# Patient Record
Sex: Female | Born: 1975
Health system: Southern US, Community
[De-identification: ages and names within clinical notes are randomized; demographics above are authoritative.]

## PROBLEM LIST (undated history)

## (undated) ENCOUNTER — Emergency Department (HOSPITAL_COMMUNITY): Admission: EM | Payer: Self-pay | Source: Home / Self Care

## (undated) DIAGNOSIS — K900 Celiac disease: Secondary | ICD-10-CM

## (undated) DIAGNOSIS — F32A Depression, unspecified: Secondary | ICD-10-CM

## (undated) DIAGNOSIS — F988 Other specified behavioral and emotional disorders with onset usually occurring in childhood and adolescence: Secondary | ICD-10-CM

## (undated) DIAGNOSIS — G588 Other specified mononeuropathies: Secondary | ICD-10-CM

## (undated) DIAGNOSIS — F329 Major depressive disorder, single episode, unspecified: Secondary | ICD-10-CM

## (undated) HISTORY — DX: Major depressive disorder, single episode, unspecified: F32.9

## (undated) HISTORY — DX: Other specified behavioral and emotional disorders with onset usually occurring in childhood and adolescence: F98.8

## (undated) HISTORY — DX: Other specified mononeuropathies: G58.8

## (undated) HISTORY — DX: Depression, unspecified: F32.A

## (undated) HISTORY — DX: Celiac disease: K90.0

---

## 2006-12-16 ENCOUNTER — Emergency Department (HOSPITAL_COMMUNITY): Admission: EM | Admit: 2006-12-16 | Discharge: 2006-12-16 | Payer: Self-pay | Admitting: Pediatrics

## 2010-08-03 ENCOUNTER — Inpatient Hospital Stay (HOSPITAL_COMMUNITY)
Admission: EM | Admit: 2010-08-03 | Discharge: 2010-08-06 | DRG: 917 | Disposition: A | Discharge status: Other Acute Inpatient Hospital | Payer: 59 | Attending: Internal Medicine | Admitting: Internal Medicine

## 2010-08-03 ENCOUNTER — Emergency Department (HOSPITAL_COMMUNITY): Payer: 59

## 2010-08-03 DIAGNOSIS — G929 Unspecified toxic encephalopathy: Secondary | ICD-10-CM | POA: Diagnosis present

## 2010-08-03 DIAGNOSIS — K900 Celiac disease: Secondary | ICD-10-CM | POA: Diagnosis present

## 2010-08-03 DIAGNOSIS — E872 Acidosis, unspecified: Secondary | ICD-10-CM | POA: Diagnosis present

## 2010-08-03 DIAGNOSIS — G92 Toxic encephalopathy: Secondary | ICD-10-CM | POA: Diagnosis present

## 2010-08-03 DIAGNOSIS — Y92009 Unspecified place in unspecified non-institutional (private) residence as the place of occurrence of the external cause: Secondary | ICD-10-CM

## 2010-08-03 DIAGNOSIS — L502 Urticaria due to cold and heat: Secondary | ICD-10-CM | POA: Diagnosis present

## 2010-08-03 DIAGNOSIS — T424X4A Poisoning by benzodiazepines, undetermined, initial encounter: Principal | ICD-10-CM | POA: Diagnosis present

## 2010-08-03 DIAGNOSIS — T438X2A Poisoning by other psychotropic drugs, intentional self-harm, initial encounter: Secondary | ICD-10-CM | POA: Diagnosis present

## 2010-08-03 DIAGNOSIS — R946 Abnormal results of thyroid function studies: Secondary | ICD-10-CM | POA: Diagnosis present

## 2010-08-03 DIAGNOSIS — T43502A Poisoning by unspecified antipsychotics and neuroleptics, intentional self-harm, initial encounter: Secondary | ICD-10-CM | POA: Diagnosis present

## 2010-08-03 LAB — CBC
Hemoglobin: 13.8 g/dL (ref 12.0–15.0)
MCH: 29.9 pg (ref 26.0–34.0)
MCHC: 34.6 g/dL (ref 30.0–36.0)
MCV: 86.6 fL (ref 78.0–100.0)
Platelets: 195 10*3/uL (ref 150–400)
RBC: 4.61 MIL/uL (ref 3.87–5.11)

## 2010-08-03 LAB — DIFFERENTIAL
Eosinophils Absolute: 0 10*3/uL (ref 0.0–0.7)
Lymphs Abs: 0.9 10*3/uL (ref 0.7–4.0)
Monocytes Absolute: 0.5 10*3/uL (ref 0.1–1.0)
Monocytes Relative: 8 % (ref 3–12)
Neutrophils Relative %: 75 % (ref 43–77)

## 2010-08-03 LAB — ACETAMINOPHEN LEVEL: Acetaminophen (Tylenol), Serum: <15 ug/mL (ref 10–30)

## 2010-08-03 LAB — COMPREHENSIVE METABOLIC PANEL
CO2: 20 mEq/L (ref 19–32)
Calcium: 8.6 mg/dL (ref 8.4–10.5)
Creatinine, Ser: 0.67 mg/dL (ref 0.50–1.10)
GFR calc Af Amer: >60 mL/min (ref >60)
GFR calc non Af Amer: >60 mL/min (ref >60)
Glucose, Bld: 189 mg/dL — ABNORMAL HIGH (ref 70–99)
Total Protein: 6.4 g/dL (ref 6.0–8.3)

## 2010-08-03 LAB — SALICYLATE LEVEL: Salicylate Lvl: <2 mg/dL — ABNORMAL LOW (ref 2.8–20.0)

## 2010-08-04 ENCOUNTER — Encounter (HOSPITAL_COMMUNITY): Payer: Self-pay

## 2010-08-04 ENCOUNTER — Inpatient Hospital Stay (HOSPITAL_COMMUNITY): Payer: 59

## 2010-08-04 LAB — CBC
HCT: 37.6 % (ref 36.0–46.0)
MCH: 29.9 pg (ref 26.0–34.0)
MCV: 89.1 fL (ref 78.0–100.0)
Platelets: 162 10*3/uL (ref 150–400)
RBC: 4.22 MIL/uL (ref 3.87–5.11)
WBC: 4.2 10*3/uL (ref 4.0–10.5)

## 2010-08-04 LAB — DIFFERENTIAL
Eosinophils Absolute: 0.1 10*3/uL (ref 0.0–0.7)
Lymphocytes Relative: 31 % (ref 12–46)
Lymphs Abs: 1.3 10*3/uL (ref 0.7–4.0)
Monocytes Relative: 9 % (ref 3–12)
Neutrophils Relative %: 58 % (ref 43–77)

## 2010-08-04 LAB — BASIC METABOLIC PANEL
BUN: 8 mg/dL (ref 6–23)
CO2: 23 mEq/L (ref 19–32)
Chloride: 117 mEq/L — ABNORMAL HIGH (ref 96–112)
GFR calc non Af Amer: >60 mL/min (ref >60)
Glucose, Bld: 99 mg/dL (ref 70–99)
Potassium: 4.3 mEq/L (ref 3.5–5.1)
Sodium: 144 mEq/L (ref 135–145)

## 2010-08-04 LAB — HEPATIC FUNCTION PANEL
AST: 20 U/L (ref 0–37)
Albumin: 2.7 g/dL — ABNORMAL LOW (ref 3.5–5.2)
Total Protein: 5.2 g/dL — ABNORMAL LOW (ref 6.0–8.3)

## 2010-08-04 LAB — BLOOD GAS, ARTERIAL
Acid-base deficit: 7.8 mmol/L — ABNORMAL HIGH (ref 0.0–2.0)
Bicarbonate: 19.5 mEq/L — ABNORMAL LOW (ref 20.0–24.0)
Delivery systems: POSITIVE
Drawn by: 232811
FIO2: 0.3 %
O2 Content: 2 L/min
Patient temperature: 96.5
TCO2: 18.4 mmol/L (ref 0–100)
pCO2 arterial: 49.5 mmHg — ABNORMAL HIGH (ref 35.0–45.0)
pCO2 arterial: 46.7 mmHg — ABNORMAL HIGH (ref 35.0–45.0)
pH, Arterial: 7.22 — ABNORMAL LOW (ref 7.350–7.400)

## 2010-08-04 LAB — GLUCOSE, CAPILLARY
Glucose-Capillary: 62 mg/dL — ABNORMAL LOW (ref 70–99)
Glucose-Capillary: 101 mg/dL — ABNORMAL HIGH (ref 70–99)

## 2010-08-04 LAB — CARDIAC PANEL(CRET KIN+CKTOT+MB+TROPI)
Relative Index: 1.7 (ref 0.0–2.5)
Relative Index: 1.9 (ref 0.0–2.5)
Relative Index: 1.5 (ref 0.0–2.5)
Troponin I: <0.3 ng/mL (ref <0.30)
Troponin I: <0.3 ng/mL (ref <0.30)

## 2010-08-04 LAB — MRSA PCR SCREENING: MRSA by PCR: NEGATIVE

## 2010-08-04 LAB — LACTIC ACID, PLASMA: Lactic Acid, Venous: 1.1 mmol/L (ref 0.5–2.2)

## 2010-08-04 LAB — ACETAMINOPHEN LEVEL: Acetaminophen (Tylenol), Serum: <15 ug/mL (ref 10–30)

## 2010-08-04 LAB — PREGNANCY, URINE: Preg Test, Ur: NEGATIVE

## 2010-08-05 DIAGNOSIS — F39 Unspecified mood [affective] disorder: Secondary | ICD-10-CM

## 2010-08-05 LAB — BASIC METABOLIC PANEL
Chloride: 110 mEq/L (ref 96–112)
GFR calc Af Amer: >60 mL/min (ref >60)
GFR calc non Af Amer: >60 mL/min (ref >60)
Potassium: 3.7 mEq/L (ref 3.5–5.1)
Sodium: 139 mEq/L (ref 135–145)

## 2010-08-05 LAB — CBC
Hemoglobin: 13.4 g/dL (ref 12.0–15.0)
MCHC: 33.8 g/dL (ref 30.0–36.0)
RDW: 13 % (ref 11.5–15.5)
WBC: 5.9 10*3/uL (ref 4.0–10.5)

## 2010-08-05 LAB — GLUCOSE, CAPILLARY: Glucose-Capillary: 130 mg/dL — ABNORMAL HIGH (ref 70–99)

## 2010-08-06 ENCOUNTER — Inpatient Hospital Stay (HOSPITAL_COMMUNITY)
Admission: AD | Admit: 2010-08-06 | Discharge: 2010-08-07 | DRG: 882 | Disposition: A | Discharge status: Home / Self Care | Payer: 59 | Source: Ambulatory Visit | Attending: Psychiatry | Admitting: Psychiatry

## 2010-08-06 DIAGNOSIS — F4324 Adjustment disorder with disturbance of conduct: Principal | ICD-10-CM

## 2010-08-06 DIAGNOSIS — Z6379 Other stressful life events affecting family and household: Secondary | ICD-10-CM

## 2010-08-06 DIAGNOSIS — L508 Other urticaria: Secondary | ICD-10-CM

## 2010-08-06 DIAGNOSIS — T424X4A Poisoning by benzodiazepines, undetermined, initial encounter: Secondary | ICD-10-CM

## 2010-08-06 DIAGNOSIS — T43502A Poisoning by unspecified antipsychotics and neuroleptics, intentional self-harm, initial encounter: Secondary | ICD-10-CM

## 2010-08-06 DIAGNOSIS — F39 Unspecified mood [affective] disorder: Secondary | ICD-10-CM

## 2010-08-06 DIAGNOSIS — T438X2A Poisoning by other psychotropic drugs, intentional self-harm, initial encounter: Secondary | ICD-10-CM

## 2010-08-06 DIAGNOSIS — K900 Celiac disease: Secondary | ICD-10-CM

## 2010-08-06 LAB — GLUCOSE, CAPILLARY
Glucose-Capillary: 74 mg/dL (ref 70–99)
Glucose-Capillary: 107 mg/dL — ABNORMAL HIGH (ref 70–99)
Glucose-Capillary: 81 mg/dL (ref 70–99)
Glucose-Capillary: 90 mg/dL (ref 70–99)
Glucose-Capillary: 116 mg/dL — ABNORMAL HIGH (ref 70–99)

## 2010-08-07 DIAGNOSIS — F39 Unspecified mood [affective] disorder: Secondary | ICD-10-CM

## 2010-08-10 NOTE — Assessment & Plan Note (Signed)
NAME:  Michaela Bryant, Michaela Bryant         ACCOUNT NO.:  [id number]  MEDICAL RECORD NO.:  [id number]  LOCATION:  0503                          [facility]ACILITY:  [facility]  PHYSICIAN:  Rudean Curt, MD       DATE OF BIRTH:  [birth year]  DATE OF ADMISSION:  08/06/2010 DATE OF DISCHARGE:                      PSYCHIATRIC ADMISSION ASSESSMENT   The patient is a 35 year old Caucasian female originally from [place], [place], recently of [place], [place]:  Four days prior to admission, the patient impulsively took her husband's Klonopin and drove a short distance to a park.  She was barely arousable when brought to the emergency room and gradually regained her alertness along with insight into how she needs to care for herself.  She describes realizing that she needs to care for herself, and the relationship that she has with her husband has been somewhat intense in that he has been confused in how much he is willing to stick with the relationship and has been interacting with females who are also married who are interfacing with him by telephone, by text and by [place].  She was disappointed in that the lying and the compulsively flirting with other women has persisted and that she deserves to be treated with more respect.  PAST PSYCHIATRIC HISTORY:  She was in a research study for irritable bowel syndrome and turned out to not even have irritable bowel.  Was exposed to Lexapro for 6 months with no ill effects.  SOCIAL HISTORY:  The patient has a bachelor's degree in psychology, works as a Tourist information centre manager, is the mother of 110-year-old and 42 year old girls and is married 6 years to an infectious disease physician employed at [employer].  FAMILY HISTORY:  There is alcohol on her mother's side of the family that has been quite prominent and is quite problematic for several members of the family.  ALCOHOL AND DRUG HISTORY:  She started using caffeine in the last 3-4 years.  She started  using alcohol at age 53 for the very first time. Experimented a few times with THC but denies exposure to any other substances.  She knows her family history is most concerned about becoming an alcoholic.  MEDICAL HISTORY:  The patient had an appointment for [facility] for Wednesday of this past week that happened to be the day after she took her overdose and was comatose and was unable to attend that appointment.  She is looking forward to rescheduling it.  MEDICAL PROBLEMS:  Include celiac on a paleolithic diet.  MEDICINES:  Include Loestrin with iron 21-day cycle from her OB/GYN doctor.  ALCOHOL AND DRUG HISTORY:  She has nausea response to ANY NARCOTIC SHE HAS EVER TAKEN.  POSITIVE PHYSICAL FINDINGS:  She has ecchymosis below the right eye that was sustained somehow during the interactions of 4 nights ago but does not recall exactly how she acquired that.  CBC and metabolic panel reveal no abnormalities or concern.  She is not pregnant.  Her acetaminophen level is less than 15.  Her salicylate level is less than 2.  Urine drug screen was positive for amphetamines, benzodiazepines and opiates.  Alcohol level was less than 33.  MENTAL STATUS:  The patient is  alert and cooperative with the interview and has good eye contact.  She has cogent, goal-directed thoughts noted. She has natural tone, volume and rate of conversational speech.  Her mood is a 2 on a 1 the least, 10 the most scale.  Anxiety is a 2 also on a 1 the least, 10 the most scale.  Thought processes:  She denies any suicidal or homicidal ideation.  She denies any hallucinations, illusions or delusions.  Cognitive:  She has clear sensorium.  She has intact memory to recent and remote events.  She has intact judgment and insight.  DIAGNOSES:  Axis I:  Adjustment disorder with disturbance of conduct. Axis II:  Deferred. Axis III:  Celiac disease, cold- and heat-induced urticaria. Axis IV:  Moderate  occupational problems. Axis V:  Current 45, highest in past year 39.  PLAN:  Discharge home to follow up with her primary care physician and continue in counseling with her husband.  Consider attending Alcoholics Anonymous support groups to help her be able to become more self-reliant and positive with her esteem and develop some confidence and competence. Tentative length of stay is going to be less than 24 hours.          ______________________________ Rudean Curt, MD     EW/MEDQ  D:  08/07/2010  T:  08/07/2010  Job:  [id number]  Electronically Signed by Rudean Curt  on 08/10/2010 07:59:41 AM

## 2010-08-10 NOTE — Discharge Summary (Signed)
  [name], Michaela Bryant         ACCOUNT NO.:  [id number]  MEDICAL RECORD NO.:  [id number]  LOCATION:  0503                          [facility]ILITY[facility]  [facility]  PHYSICIAN:  Rudean Curt, MD       DATE OF BIRTH:  1975/03/09  DATE OF ADMISSION:  08/06/2010 DATE OF DISCHARGE:  08/07/2010                              DISCHARGE SUMMARY   REASON FOR HOSPITALIZATION:  The patient had an impulsive suicide attempt in which she took her husband's Klonopin and drove a short distance to a park.  She was quite obtunded on presentation to the emergency room.  Her urine drug screen was positive for opiates, benzodiazepines, and amphetamines.  Her alcohol level was 33.  During the hospital course, she realized that there was a great deal of support that she had from her family and that she needed to be getting her own life and establishing her own confidence.  This was a rather significant realization for her and also a great outpouring of support that she had received from family and friends.  SIGNIFICANT FINDINGS:  She has no suicidal or homicidal ideation noted. The patient realizes that she is a different person now that she has attempted this suicide attempt.  She realizes that she needs to be doing several things different for herself to be a more competent and confident person in her life.  OPERATION:  None.  FINAL DIAGNOSES:  AXIS I:  Adjustment disorder with disturbance of conduct.  Intentional overdose with benzodiazepines. AXIS II:  Deferred. AXIS III:  Celiac disease.  Heat and cold-induced urticaria.  Elevated TSH. AXIS IV:  Moderate.  Occupational problems. AXIS V:  Current is 45, in the past years 6.  DISCHARGE CONDITION:  Stable.  DISPOSITION:  Discharged to home.  It seems that her biologic family of origin are quite supportive of her and are very clear in how they expect her to be treated, and she seemingly has gained a great deal of insight in how she needs to be expecting  people to treat her more positively.          ______________________________ Rudean Curt, MD     EW/MEDQ  D:  08/07/2010  T:  08/07/2010  Job:  [id number]  Electronically Signed by Rudean Curt  on 08/10/2010 08:00:06 AM

## 2010-08-17 NOTE — Discharge Summary (Signed)
  Michaela Bryant, Michaela Bryant         ACCOUNT NO.:  [id number]  MEDICAL RECORD NO.:  [id number]  LOCATION:  7591                         FACILITY:  [facility]  PHYSICIAN:  Kathie Dike, MD     DATE OF BIRTH:  1975/10/15  DATE OF ADMISSION:  08/03/2010 DATE OF DISCHARGE:                        DISCHARGE SUMMARY - REFERRING   PRIMARY CARE PHYSICIAN:  Unassigned.  DISCHARGE DIAGNOSES: 1. Intentional overdose with benzodiazepine. 2. Suicide attempt. 3. Toxic encephalopathy secondary to #1. 4. Acidosis, resolved. 5. History of celiac disease. 6. Cold and heat induced urticaria. 7. Elevated TSH.  DISCHARGE MEDICATIONS:  Loestrin Fe 1 tablet p.o. daily.  ADMISSION HISTORY:  This is a 35 year old female who had taken a large amount of Klonopin after having argument with her husband.  The patient left the house and was subsequently found by her husband.  He called 911 when the patient was initially found in a park.  The patient was drowsy, difficult to arouse and was brought to the ER for evaluation.  She was found to be hypotensive in the emergency room after receiving 3 L of fluid.  She was subsequently admitted to the step-down unit for close monitoring and further observation.  For details, please refer to history and physical per Dr. Hal Hope on August 1.  HOSPITAL COURSE: 1. Overdose.  The patient was kept on a one-to-one sitter.  Poison     control was called and was involved in her care.  Supportive     therapy was recommended.  The patient's mental status continued to     improve.  She is currently alert and oriented and back to her     baseline.  She was seen by psychiatry service and was recommended     for transfer to inpatient psychiatry for further stabilization.     The patient is agreeable for this as is her husband and she will be     transferred once a bed is available. 2. Encephalopathy secondary to #1.  This has since resolved with     supportive care.  Remainder  of the patient's medical issues     remained stable.  CONSULTATIONS:  Psychiatry, Dr. Sherlynn Stalls.  DIAGNOSTIC IMAGING:  Chest x-ray from August 1 shows bibasilar airspace disease, this represent pneumonia or atelectasis.  DISCHARGE INSTRUCTIONS:  The patient should continue on a gluten-free diet.  She can conduct her activities as tolerated.  She will be transferred to [facility] once the is available and she will need to follow up with a primary care physician once she is discharged from [facility].  We will recommend that she have a repeat thyroid function panel rechecked in 6 weeks by a primary care physician after which the decision to start any replaced therapy can be made at that time.     Kathie Dike, MD     JM/MEDQ  D:  08/06/2010  T:  08/06/2010  Job:  [id number]  Electronically Signed by Jolaine Artist Graciella Arment  on 08/17/2010 03:23:02 PM

## 2010-09-07 NOTE — H&P (Signed)
NAMEMAYSEL, MCCOLM         ACCOUNT NO.:  [id number]  MEDICAL RECORD NO.:  [id number]  LOCATION:  WLED                         FACILITY:  [facility]  PHYSICIAN:  Rise Patience, MDDATE OF BIRTH:  05/20/1975  DATE OF ADMISSION:  08/03/2010 DATE OF DISCHARGE:                             HISTORY & PHYSICAL   PRIMARY CARE PHYSICIAN:  Unassigned.  CHIEF COMPLAINT:  Drug overdose with suicidal intention.  HISTORY OF PRESENT ILLNESS:  A 35 year old female with a history of celiac disease and heat- and cold-induced urticaria.  She had some misunderstanding with her husband and took some Klonopin at around 7 o'clock, which belonged to her husband, which was witnessed by the husband and drove away.  Her husband called 21 and eventually the car was found at a park.  The patient was found drowsy, hardly arousable, and was brought to ER.  At this time, the patient is hardly arousable. The ER physician who initially saw the patient said she was arousable and that she was talking very little, but progressively she became more drowsy.  She also was found to be hypotensive, for which the patient is being given some bolus of fluids, so far 3 L have been given.  The patient's drug screen has come positive for amphetamine, benzodiazepine, and opiate.  The patient does have a bruise on the right eye and as I discussed with the patient's husband, the patient does sometimes hit herself or hit her head on the wall whenever she gets depressed and of late she has been drinking more alcohol than usual.  In addition, the patient's husband stated that he did have a Klonopin bottle, which was missing last week, which was filled by him, usually 1 mg tablets, about 50 tablets.  As per husband, there was no mention of any chest pain or shortness of breath; any nausea, vomiting, or abdominal pain; or any seizure-like activity before.  The patient did have a suicidal attempt in March 2012 when she  tried to swallow Klonopin, but later on husband was able to make her spit it out.  PAST MEDICAL HISTORY: 1. History of celiac disease. 2. Cold- and heat-induced urticaria.  PAST SURGICAL HISTORY:  She only has had some sinus surgery when she was small.  MEDICATIONS ON ADMISSION:  The patient takes birth control pills, probably Gwen Her, which  has to be verified.  ALLERGIES:  No known drug allergies.  SOCIAL HISTORY:  The patient does not smoke cigarettes.  Recently has been drinking more alcohol because of the recent stress as per husband. Denies any drug abuse.  Married.  FAMILY HISTORY:  Nothing contributory.  REVIEW OF SYSTEMS:  As per the history of present illness, nothing else significant.  PHYSICAL EXAMINATION:  GENERAL:  The patient examined at bedside, not in acute distress. VITAL SIGNS:  Blood pressure 93/48, pulse is around 60 per minute, temperature 97.5, respirations 12 per minute, O2 sat 100%. HEENT:  PERLA positive.  No facial asymmetry.  There is a bruise and some small ecchymoses on the right periorbital area.  No discharge from ears, eyes, nose, or mouth. NECK:  There is no neck rigidity. CHEST:  Bilateral air entry present.  No rhonchi, no crepitation. HEART:  S1 and S2 heard. ABDOMEN:  Soft, nontender.  Bowel sounds heard. CNS:  The patient is drowsy, hardly arousable and further CNS examination is not possible. EXTREMITIES:  Peripheral pulses felt.  No edema.  LABORATORY DATA:  EKG shows normal sinus rhythm with nonspecific ST-T changes.  Heart rate is around 65 beats per minute.  CT of the head without contrast shows normal CT, maxillofacial areas, negative for facial fracture.  CBC, WBC 6.1, hemoglobin is 13.8, hematocrit is 39.9, platelets 195.  Complete metabolic panel, sodium 586, potassium 3.3, chloride 106, carbon dioxide 20, anion gap is 12, glucose 189, BUN 11, creatinine 0.6, total bilirubin is 0.3, alkaline phosphatase 53, AST 24, ALT 14,  total protein 6.4, albumin 3.7, calcium 8.6.  Pregnancy screen is negative.  Acetaminophen level less than 15.  Salicylate level less than 2.  Urine drug screen is positive for amphetamine, benzodiazepine, and opiate.  Alcohol level less than 33.  ASSESSMENT: 1. Drug overdose with suicidal ideation. 2. History of celiac disease. 3. History of cold- and heat-induced urticaria.  PLAN: 1. At this time, we will admit the patient to ICU. 2. For her drug overdose, which at this time most likely could be from     [place], we do not know exactly how much dose she could have     taken.  As per husband, she might have taken up to 4 mg total, but     the bottle which he had last week which he had just recently filled     was lost.  The patient also is positive for amphetamine and     opioids, but negative for Tylenol at this time.  I did call Poison     Control at this time.  They have recommended supportive management.     We will get an ABG at this time, repeat labs again in a.m.  We will     also let PCCM know about the patient's admission.  At this time, we     are going to aggressively hydrate the patient. 3. At this time, the patient will be on seizure precautions and     suicide precaution with 1:1 sitter. 4. We will keep the patient on p.r.n. Ativan for any agitation or     seizure-like activity. 5. Once the patient is alert and awake, will need psychiatric consult. 6. Further recommendations as condition evolves.    Rise Patience, MD    ANK/MEDQ  D:  08/04/2010  T:  08/04/2010  Job:  [id number]  Electronically Signed by Gean Birchwood MD on 09/07/2010 08:57:36 AM

## 2012-06-16 DIAGNOSIS — IMO0002 Reserved for concepts with insufficient information to code with codable children: Secondary | ICD-10-CM | POA: Insufficient documentation

## 2012-11-12 ENCOUNTER — Other Ambulatory Visit: Payer: Self-pay | Admitting: Gastroenterology

## 2012-11-12 DIAGNOSIS — Z111 Encounter for screening for respiratory tuberculosis: Secondary | ICD-10-CM

## 2012-11-13 ENCOUNTER — Ambulatory Visit
Admission: RE | Admit: 2012-11-13 | Discharge: 2012-11-13 | Disposition: A | Discharge status: Home / Self Care | Payer: 59 | Source: Ambulatory Visit | Attending: Gastroenterology | Admitting: Gastroenterology

## 2012-11-13 DIAGNOSIS — Z111 Encounter for screening for respiratory tuberculosis: Secondary | ICD-10-CM

## 2013-03-06 ENCOUNTER — Emergency Department (HOSPITAL_COMMUNITY)
Admission: EM | Admit: 2013-03-06 | Discharge: 2013-03-07 | Disposition: A | Discharge status: Home / Self Care | Payer: 59 | Attending: Emergency Medicine | Admitting: Emergency Medicine

## 2013-03-06 ENCOUNTER — Emergency Department (HOSPITAL_COMMUNITY): Payer: 59

## 2013-03-06 ENCOUNTER — Encounter (HOSPITAL_COMMUNITY): Payer: Self-pay | Admitting: Emergency Medicine

## 2013-03-06 DIAGNOSIS — R51 Headache: Secondary | ICD-10-CM

## 2013-03-06 DIAGNOSIS — Z3202 Encounter for pregnancy test, result negative: Secondary | ICD-10-CM | POA: Insufficient documentation

## 2013-03-06 DIAGNOSIS — W1809XA Striking against other object with subsequent fall, initial encounter: Secondary | ICD-10-CM | POA: Insufficient documentation

## 2013-03-06 DIAGNOSIS — R110 Nausea: Secondary | ICD-10-CM

## 2013-03-06 DIAGNOSIS — Y9389 Activity, other specified: Secondary | ICD-10-CM | POA: Insufficient documentation

## 2013-03-06 DIAGNOSIS — Y929 Unspecified place or not applicable: Secondary | ICD-10-CM | POA: Insufficient documentation

## 2013-03-06 DIAGNOSIS — R519 Headache, unspecified: Secondary | ICD-10-CM

## 2013-03-06 DIAGNOSIS — S0990XA Unspecified injury of head, initial encounter: Secondary | ICD-10-CM | POA: Insufficient documentation

## 2013-03-06 DIAGNOSIS — R112 Nausea with vomiting, unspecified: Secondary | ICD-10-CM | POA: Insufficient documentation

## 2013-03-06 LAB — CBC
HCT: 39.7 % (ref 36.0–46.0)
Hemoglobin: 13.8 g/dL (ref 12.0–15.0)
MCH: 29.9 pg (ref 26.0–34.0)
MCHC: 34.8 g/dL (ref 30.0–36.0)
MCV: 86.1 fL (ref 78.0–100.0)
PLATELETS: 203 10*3/uL (ref 150–400)
RBC: 4.61 MIL/uL (ref 3.87–5.11)
RDW: 12.3 % (ref 11.5–15.5)
WBC: 4.9 10*3/uL (ref 4.0–10.5)

## 2013-03-06 MED ORDER — METOCLOPRAMIDE HCL 5 MG/ML IJ SOLN
10.0000 mg | Freq: Once | INTRAMUSCULAR | Status: AC
  Administered 2013-03-06: 10 mg via INTRAVENOUS
  Filled 2013-03-06: qty 2

## 2013-03-06 MED ORDER — PROMETHAZINE HCL 25 MG PO TABS
25.0000 mg | ORAL_TABLET | ORAL | Status: DC | PRN
Start: 2013-03-06 — End: 2016-11-05

## 2013-03-06 MED ORDER — DIPHENHYDRAMINE HCL 50 MG/ML IJ SOLN
25.0000 mg | Freq: Once | INTRAMUSCULAR | Status: AC
  Administered 2013-03-06: 25 mg via INTRAVENOUS
  Filled 2013-03-06: qty 1

## 2013-03-06 MED ORDER — PROMETHAZINE HCL 25 MG PO TABS
25.0000 mg | ORAL_TABLET | Freq: Once | ORAL | Status: DC
  Filled 2013-03-06: qty 1

## 2013-03-06 MED ORDER — SODIUM CHLORIDE 0.9 % IV BOLUS (SEPSIS)
1000.0000 mL | Freq: Once | INTRAVENOUS | Status: AC
  Administered 2013-03-06: 1000 mL via INTRAVENOUS

## 2013-03-06 NOTE — ED Provider Notes (Signed)
CSN: [id number]     Arrival date & time 03/06/13  2223 History   First MD Initiated Contact with Patient 03/06/13 2238     Chief Complaint  Patient presents with  . Headache  . Nausea  . Emesis     (Consider location/radiation/quality/duration/timing/severity/associated sxs/prior Treatment) HPI Comments: 38 year old female presents with headache, nausea, and vomiting. She states that 5 days ago she fell and hit her head on the dresser. Her headache improved and then yesterday she had her hair styled and she was told to swing her head back and forth very slow motion picture. During this time she shook her head back and forth for 49 seconds. Since then she has felt nauseous, had a bitemporal headache, and dizziness. She has a history of migraines because of his headache is low worse. The headache seems to come and go. She's tried Zofran at home without success. She still vomiting and having dizziness. Denies abdominal pain, diarrhea, or urinary symptoms. She'll blurry vision earlier this morning but denies any now. Her family is concerned about a concussion and/or brain bleed.   History reviewed. No pertinent past medical history. History reviewed. No pertinent past surgical history. No family history on file. History  Substance Use Topics  . Smoking status: Not on file  . Smokeless tobacco: Not on file  . Alcohol Use: Not on file   OB History   Grav Para Term Preterm Abortions TAB SAB Ect Mult Living                 Review of Systems  Eyes: Negative for visual disturbance.  Respiratory: Negative for shortness of breath.   Cardiovascular: Negative for chest pain.  Gastrointestinal: Positive for nausea and vomiting. Negative for abdominal pain.  Genitourinary: Negative for dysuria and menstrual problem.  Musculoskeletal: Negative for gait problem.  Neurological: Positive for dizziness and headaches. Negative for syncope, weakness and numbness.  All other systems reviewed and are  negative.      Allergies  Review of patient's allergies indicates no known allergies.  Home Medications  No current outpatient prescriptions on file. There were no vitals taken for this visit. Physical Exam  Nursing note and vitals reviewed. Constitutional: She is oriented to person, place, and time. She appears well-developed and well-nourished.  HENT:  Head: Normocephalic and atraumatic.  Right Ear: External ear normal.  Left Ear: External ear normal.  Nose: Nose normal.  Eyes: EOM are normal. Pupils are equal, round, and reactive to light. Right eye exhibits no discharge. Left eye exhibits no discharge.  No nystagmus  Cardiovascular: Normal rate, regular rhythm and normal heart sounds.   Pulmonary/Chest: Effort normal and breath sounds normal.  Abdominal: Soft. There is no tenderness.  Neurological: She is alert and oriented to person, place, and time. She has normal strength. No cranial nerve deficit or sensory deficit. GCS eye subscore is 4. GCS verbal subscore is 5. GCS motor subscore is 6.  CN 2-12 grossly intact. 5/5 Strength in all 4 extremities. Normal cerebellar testing.  Skin: Skin is warm and dry.    ED Course  Procedures (including critical care time) Labs Review Labs Reviewed  CBC  BASIC METABOLIC PANEL  URINALYSIS, ROUTINE W REFLEX MICROSCOPIC   Imaging Review Ct Head Wo Contrast  03/06/2013   CLINICAL DATA:  Syncope with question of head injury  EXAM: CT HEAD WITHOUT CONTRAST  TECHNIQUE: Contiguous axial images were obtained from the base of the skull through the vertex without intravenous contrast.  COMPARISON:  Prior CT from 07/06/2010  FINDINGS: Few scattered hypodensities within the periventricular and deep white matter are present, likely related to mild chronic microvascular ischemic disease. There is no acute intracranial hemorrhage or infarct. No mass lesion or midline shift. Gray-white matter differentiation is well maintained. Ventricles are normal in  size without evidence of hydrocephalus. CSF containing spaces are within normal limits. No extra-axial fluid collection.  The calvarium is intact.  Orbital soft tissues are within normal limits.  The paranasal sinuses and mastoid air cells are well pneumatized and free of fluid.  Scalp soft tissues are unremarkable.  IMPRESSION: No acute intracranial process.   Electronically Signed   By: Jeannine Boga M.D.   On: 03/06/2013 23:39     EKG Interpretation None      MDM   Final diagnoses:  Headache  Nausea    Patient is well appearing, has normal neuro exam. No nystagmus. Family concerned patient has subacute intracranial injury. CT head negative. Given no signs of trauma and gradual intermittent headache I doubt acute intracranial injury. Feels improved with headache cocktail. She states this is similar to her migraines but a little worse. No vomiting in ED. Benign labs. With normal neuro exam and well appearance I do not feel she needs further imaging or workup. I feel she can be treated symptomatically and follow up with PCP. Will ambulate prior to discharge.    Ephraim Hamburger, MD 03/07/13 [phone]

## 2013-03-06 NOTE — Discharge Instructions (Signed)

## 2013-03-06 NOTE — ED Notes (Signed)
MD at bedside. 

## 2013-03-06 NOTE — ED Notes (Signed)
Pt from home c/o a headache, nausea, and vomiting from a fall that occurred on Friday. She fell and hit top front part of her head on a safe. No LOC. Yesterday she felt okay until she had her hair dyed and the stylist got her to shake her head fast. Since then she has had increased nausea and vomiting. She took 6m of Zofran before arrival but has thrown up since. She also reports hitting her head in Dec. Then she was confused and nauseated.

## 2013-03-07 LAB — BASIC METABOLIC PANEL
BUN: 11 mg/dL (ref 6–23)
CHLORIDE: 103 meq/L (ref 96–112)
CO2: 26 mEq/L (ref 19–32)
Calcium: 9.3 mg/dL (ref 8.4–10.5)
Creatinine, Ser: 0.67 mg/dL (ref 0.50–1.10)
Glucose, Bld: 99 mg/dL (ref 70–99)
POTASSIUM: 3.5 meq/L — AB (ref 3.7–5.3)
Sodium: 142 mEq/L (ref 137–147)

## 2013-03-07 LAB — URINALYSIS, ROUTINE W REFLEX MICROSCOPIC
BILIRUBIN URINE: NEGATIVE
Glucose, UA: NEGATIVE mg/dL
Ketones, ur: 15 mg/dL — AB
NITRITE: NEGATIVE
PH: 6.5 (ref 5.0–8.0)
Protein, ur: NEGATIVE mg/dL
Specific Gravity, Urine: 1.014 (ref 1.005–1.030)
UROBILINOGEN UA: 0.2 mg/dL (ref 0.0–1.0)

## 2013-03-07 LAB — URINE MICROSCOPIC-ADD ON

## 2013-03-07 LAB — POC URINE PREG, ED: PREG TEST UR: NEGATIVE

## 2013-03-07 MED ORDER — PROMETHAZINE HCL 25 MG PO TABS
12.5000 mg | ORAL_TABLET | Freq: Once | ORAL | Status: AC
  Administered 2013-03-07: 12.5 mg via ORAL
  Filled 2013-03-07: qty 1

## 2013-03-07 NOTE — ED Notes (Signed)
Pt declined phenergan at this time.  Pt denied nausea.

## 2013-03-07 NOTE — ED Provider Notes (Signed)
Patient evaluated after labs reviewed. She still having some nausea at this time would like some Phenergan. She initially declined because in the past it has caused her to be very sedated.  She has gotten up and walked to the bathroom without any significant dizziness. No headache at this time. Reglan earlier did make her feel better but no nausea is returning. No nystagmus. Speech clear. Normal gait. No unilateral deficits. No neuro deficits. Given onset and mechanism, considered vertebral artery dissection but is not having any neuro deficits or strokelike symptoms. Discussed with neurology on call, Dr. Leonel Ramsay recommends treatment for migraine and if remains symptomatic consider MRI.   I discussed these recommendations with patient and her husband bedside. No MRI is available until the morning. They both feel comfortable with trying Phenergan now and agree to return the morning if symptoms are not improved, to have MRI at that time.    Michaela Lower, MD 03/07/13 [phone]

## 2013-03-07 NOTE — ED Notes (Signed)
Pt ambulated with minimal   Assistance no c/o dizziness or light headiness. Rn notifited

## 2013-04-14 ENCOUNTER — Other Ambulatory Visit: Payer: Self-pay | Admitting: Infectious Disease

## 2013-04-14 MED ORDER — POLYMYXIN B-TRIMETHOPRIM 10000-0.1 UNIT/ML-% OP SOLN: 1.0000 [drp] | OPHTHALMIC | Status: DC

## 2013-07-04 LAB — HM PAP SMEAR: HM Pap smear: NEGATIVE

## 2013-09-12 ENCOUNTER — Other Ambulatory Visit: Payer: Self-pay | Admitting: Infectious Disease

## 2014-01-13 ENCOUNTER — Other Ambulatory Visit: Payer: Self-pay | Admitting: Infectious Disease

## 2014-01-13 MED ORDER — ONDANSETRON HCL 4 MG PO TABS: 4.0000 mg | ORAL_TABLET | Freq: Three times a day (TID) | ORAL | Status: DC | PRN

## 2014-08-21 ENCOUNTER — Other Ambulatory Visit: Payer: Self-pay | Admitting: *Deleted

## 2014-08-21 DIAGNOSIS — M5442 Lumbago with sciatica, left side: Principal | ICD-10-CM

## 2014-08-21 DIAGNOSIS — M5441 Lumbago with sciatica, right side: Secondary | ICD-10-CM

## 2014-08-22 ENCOUNTER — Encounter (HOSPITAL_COMMUNITY): Payer: Self-pay

## 2014-08-22 ENCOUNTER — Ambulatory Visit (HOSPITAL_COMMUNITY)
Admission: RE | Admit: 2014-08-22 | Discharge: 2014-08-22 | Disposition: A | Discharge status: Home / Self Care | Payer: 59 | Source: Ambulatory Visit | Attending: Infectious Diseases | Admitting: Infectious Diseases

## 2014-08-22 DIAGNOSIS — M5441 Lumbago with sciatica, right side: Secondary | ICD-10-CM

## 2014-08-22 DIAGNOSIS — M5442 Lumbago with sciatica, left side: Secondary | ICD-10-CM

## 2014-08-22 DIAGNOSIS — M545 Low back pain: Secondary | ICD-10-CM | POA: Diagnosis present

## 2014-08-22 DIAGNOSIS — M5127 Other intervertebral disc displacement, lumbosacral region: Secondary | ICD-10-CM | POA: Insufficient documentation

## 2014-08-22 MED ORDER — GADOBENATE DIMEGLUMINE 529 MG/ML IV SOLN
10.0000 mL | Freq: Once | INTRAVENOUS | Status: AC
  Administered 2014-08-22: 10 mL via INTRAVENOUS

## 2015-03-22 ENCOUNTER — Other Ambulatory Visit: Payer: Self-pay | Admitting: Infectious Disease

## 2015-03-22 DIAGNOSIS — J111 Influenza due to unidentified influenza virus with other respiratory manifestations: Secondary | ICD-10-CM

## 2015-03-22 MED ORDER — PREDNISONE 20 MG PO TABS: 40.0000 mg | ORAL_TABLET | Freq: Every day | ORAL | Status: DC

## 2015-03-22 MED ORDER — OSELTAMIVIR PHOSPHATE 75 MG PO CAPS: 75.0000 mg | ORAL_CAPSULE | Freq: Two times a day (BID) | ORAL | Status: DC

## 2015-04-14 DIAGNOSIS — Z319 Encounter for procreative management, unspecified: Secondary | ICD-10-CM | POA: Diagnosis not present

## 2015-05-11 DIAGNOSIS — H5213 Myopia, bilateral: Secondary | ICD-10-CM | POA: Diagnosis not present

## 2015-05-11 DIAGNOSIS — H52223 Regular astigmatism, bilateral: Secondary | ICD-10-CM | POA: Diagnosis not present

## 2015-06-03 DIAGNOSIS — R600 Localized edema: Secondary | ICD-10-CM | POA: Diagnosis not present

## 2015-06-03 DIAGNOSIS — F900 Attention-deficit hyperactivity disorder, predominantly inattentive type: Secondary | ICD-10-CM | POA: Diagnosis not present

## 2015-06-04 MED FILL — HYDROCHLOROTHIAZIDE 12.5 MG: 12.5 | 30 days supply | Qty: 30 | Fill #0 | Status: TO

## 2015-06-26 DIAGNOSIS — R600 Localized edema: Secondary | ICD-10-CM | POA: Diagnosis not present

## 2015-07-01 ENCOUNTER — Other Ambulatory Visit: Payer: Self-pay

## 2015-07-01 DIAGNOSIS — R609 Edema, unspecified: Secondary | ICD-10-CM

## 2015-08-06 MED FILL — HYDROCHLOROTHIAZIDE 12.5 MG: 12.5 | 30 days supply | Qty: 30 | Fill #0

## 2015-08-18 ENCOUNTER — Encounter: Payer: Self-pay | Admitting: Vascular Surgery

## 2015-08-20 ENCOUNTER — Encounter: Payer: Self-pay | Admitting: Vascular Surgery

## 2015-08-20 ENCOUNTER — Ambulatory Visit (INDEPENDENT_AMBULATORY_CARE_PROVIDER_SITE_OTHER): Payer: 59 | Admitting: Vascular Surgery

## 2015-08-20 ENCOUNTER — Ambulatory Visit (HOSPITAL_COMMUNITY)
Admission: RE | Admit: 2015-08-20 | Discharge: 2015-08-20 | Disposition: A | Discharge status: Home / Self Care | Payer: 59 | Source: Ambulatory Visit | Attending: Vascular Surgery | Admitting: Vascular Surgery

## 2015-08-20 VITALS — BP 116/76 | HR 74 | Ht <= 58 in | Wt 112.6 lb

## 2015-08-20 DIAGNOSIS — R609 Edema, unspecified: Secondary | ICD-10-CM | POA: Insufficient documentation

## 2015-08-20 DIAGNOSIS — M7989 Other specified soft tissue disorders: Secondary | ICD-10-CM | POA: Diagnosis not present

## 2015-08-20 NOTE — Progress Notes (Signed)
Referring Physician: Hortencia Conradi  Patient name: Michaela Bryant MRN: [id number] DOB: 1975/06/02 Sex: female  REASON FOR CONSULT: Chronic bilateral leg swelling  HPI: Michaela Bryant is a 40 y.o. female,  who complains of chronic leg swelling that has been going on for several years. She states the swelling gets worse in her legs after standing all day. She states that her legs are usually normal in the morning. She has not noticed any relationship of swelling with her menstrual cycle. She denies any trauma to the lower extremities. She has been placed on hydrochlorothiazide recently and this did help somewhat. She has no family history of varicose veins or hypercoagulable state. She has no personal history of DVT. She has no family history of varicose veins. She states both legs will swell really symmetrically and equally.   History reviewed. No pertinent past medical history. History reviewed. No pertinent surgical history.  History reviewed. No pertinent family history.  SOCIAL HISTORY: Social History   Social History  . Marital status: Married    Spouse name: N/A  . Number of children: N/A  . Years of education: N/A   Occupational History  . Not on file.   Social History Main Topics  . Smoking status: Never Smoker  . Smokeless tobacco: Never Used  . Alcohol use Yes     Comment: occasionally   . Drug use: No  . Sexual activity: Not on file   Other Topics Concern  . Not on file   Social History Narrative  . No narrative on file    No Known Allergies  Current Outpatient Prescriptions  Medication Sig Dispense Refill  . hydrochlorothiazide (MICROZIDE) 12.5 MG capsule TAKE 1 CAPSULE BY MOUTH DAILY IN THE MORNING AS NEEDED  0  . ibuprofen (ADVIL,MOTRIN) 200 MG tablet Take 800 mg by mouth every 6 (six) hours as needed.    . ondansetron (ZOFRAN) 4 MG tablet Take 1 tablet (4 mg total) by mouth every 8 (eight) hours as needed for nausea or vomiting. 90 tablet 0  .  predniSONE (DELTASONE) 20 MG tablet Take 2 tablets (40 mg total) by mouth daily with breakfast. 10 tablet 0  . promethazine (PHENERGAN) 25 MG tablet Take 1 tablet (25 mg total) by mouth every 4 (four) hours as needed for nausea or vomiting. 20 tablet 0  . trimethoprim-polymyxin b (POLYTRIM) ophthalmic solution Place 1 drop into both eyes every 4 (four) hours. 10 mL 0  . amphetamine-dextroamphetamine (ADDERALL XR) 10 MG 24 hr capsule Take 10 mg by mouth daily.  0  . oseltamivir (TAMIFLU) 75 MG capsule Take 1 capsule (75 mg total) by mouth 2 (two) times daily. (Patient not taking: Reported on 08/20/2015) 10 capsule 0   No current facility-administered medications for this visit.     ROS:   General:  No weight loss, Fever, chills  HEENT: No recent headaches, no nasal bleeding, no visual changes, no sore throat  Neurologic: No dizziness, blackouts, seizures. No recent symptoms of stroke or mini- stroke. No recent episodes of slurred speech, or temporary blindness.  Cardiac: No recent episodes of chest pain/pressure, no shortness of breath at rest.  No shortness of breath with exertion.  Denies history of atrial fibrillation or irregular heartbeat  Vascular: No history of rest pain in feet.  No history of claudication.  No history of non-healing ulcer, No history of DVT   Pulmonary: No home oxygen, no productive cough, no hemoptysis,  No asthma or wheezing  Musculoskeletal:  [ ]   Arthritis, [ ]  Low back pain,  [ ]  Joint pain  Hematologic:No history of hypercoagulable state.  No history of easy bleeding.  No history of anemia  Gastrointestinal: No hematochezia or melena,  No gastroesophageal reflux, no trouble swallowing  Urinary: [ ]  chronic Kidney disease, [ ]  on HD - [ ]  MWF or [ ]  TTHS, [ ]  Burning with urination, [ ]  Frequent urination, [ ]  Difficulty urinating;   Skin: No rashes  Psychological: No history of anxiety,  No history of depression   Physical Examination  General:   Alert and oriented, no acute distress HEENT: Normal Neck: No bruit or JVD Pulmonary: Clear to auscultation bilaterally Cardiac: Regular Rate and Rhythm without murmur Abdomen: Soft, non-tender, non-distended, no mass, no scars Skin: No rash Extremity Pulses:  2+ radial, brachial, femoral, dorsalis pedis, posterior tibial pulses bilaterally Musculoskeletal: No deformity or edema  Neurologic: Upper and lower extremity motor 5/5 and symmetric  DATA:  Patient had a venous duplex exam today which I reviewed and interpreted. This showed no evidence of reflux in the deep or superficial venous system. There was also no evidence of DVT.  ASSESSMENT:  Chronic intermittent leg swelling no evidence of venous or arterial disease as a possible etiology.   PLAN:  Patient was eyes to wear some compression stockings were symptomatically relief. She will continue to follow up with her primary care physician at the swelling becomes more bothersome in the future. She apparently has undergone previous blood work suggesting that there is no renal or hepatic etiology. On the other option would be whether or not to look for a cardiac etiology but she really has no symptoms suggestive of distant believe that overall this would be probably low yield. The patient will follow-up with me on an as-needed basis.   Ruta Hinds, MD [facility] Office: [phone] Pager: [phone]

## 2015-09-01 DIAGNOSIS — Z23 Encounter for immunization: Secondary | ICD-10-CM | POA: Diagnosis not present

## 2015-09-02 MED FILL — HYDROCHLOROTHIAZIDE 12.5 MG: 12.5 | 30 days supply | Qty: 30 | Fill #1

## 2015-10-08 MED FILL — HYDROCHLOROTHIAZIDE 12.5 MG: 12.5 | 30 days supply | Qty: 30 | Fill #2

## 2015-11-06 MED FILL — HYDROCHLOROTHIAZIDE 12.5 MG: 12.5 | 30 days supply | Qty: 30 | Fill #0

## 2015-11-20 DIAGNOSIS — F900 Attention-deficit hyperactivity disorder, predominantly inattentive type: Secondary | ICD-10-CM | POA: Diagnosis not present

## 2015-11-20 DIAGNOSIS — R600 Localized edema: Secondary | ICD-10-CM | POA: Diagnosis not present

## 2015-12-04 MED FILL — HYDROCHLOROTHIAZIDE 12.5 MG: 12.5 | 90 days supply | Qty: 90 | Fill #0

## 2016-03-15 MED FILL — HYDROCHLOROTHIAZIDE 12.5 MG: 12.5 | 90 days supply | Qty: 90 | Fill #1

## 2016-03-22 ENCOUNTER — Other Ambulatory Visit: Payer: Self-pay | Admitting: Internal Medicine

## 2016-05-18 DIAGNOSIS — R609 Edema, unspecified: Secondary | ICD-10-CM | POA: Diagnosis not present

## 2016-05-18 DIAGNOSIS — F988 Other specified behavioral and emotional disorders with onset usually occurring in childhood and adolescence: Secondary | ICD-10-CM | POA: Diagnosis not present

## 2016-05-18 MED FILL — VENTOLIN HFA 90 MCG INHALER: 108 (90 BAS | 16 days supply | Qty: 18 | Fill #0

## 2016-05-18 MED FILL — HYDROCHLOROTHIAZIDE 25 MG T: 25 | 90 days supply | Qty: 90 | Fill #0

## 2016-05-19 DIAGNOSIS — H524 Presbyopia: Secondary | ICD-10-CM | POA: Diagnosis not present

## 2016-05-19 DIAGNOSIS — H5213 Myopia, bilateral: Secondary | ICD-10-CM | POA: Diagnosis not present

## 2016-05-19 DIAGNOSIS — H52223 Regular astigmatism, bilateral: Secondary | ICD-10-CM | POA: Diagnosis not present

## 2016-05-23 MED FILL — DEXTROAMP-AMP 10 MG TAB: 10 | 30 days supply | Qty: 90 | Fill #0

## 2016-06-21 MED FILL — DEXTROAMP-AMP 10 MG TAB: 10 | 30 days supply | Qty: 90 | Fill #0

## 2016-07-07 DIAGNOSIS — M9905 Segmental and somatic dysfunction of pelvic region: Secondary | ICD-10-CM | POA: Diagnosis not present

## 2016-07-07 DIAGNOSIS — R293 Abnormal posture: Secondary | ICD-10-CM | POA: Diagnosis not present

## 2016-07-07 DIAGNOSIS — M2560 Stiffness of unspecified joint, not elsewhere classified: Secondary | ICD-10-CM | POA: Diagnosis not present

## 2016-07-07 DIAGNOSIS — M545 Low back pain: Secondary | ICD-10-CM | POA: Diagnosis not present

## 2016-07-07 DIAGNOSIS — M25521 Pain in right elbow: Secondary | ICD-10-CM | POA: Diagnosis not present

## 2016-07-07 DIAGNOSIS — M9903 Segmental and somatic dysfunction of lumbar region: Secondary | ICD-10-CM | POA: Diagnosis not present

## 2016-07-07 DIAGNOSIS — G43001 Migraine without aura, not intractable, with status migrainosus: Secondary | ICD-10-CM | POA: Diagnosis not present

## 2016-07-07 DIAGNOSIS — M9907 Segmental and somatic dysfunction of upper extremity: Secondary | ICD-10-CM | POA: Diagnosis not present

## 2016-08-05 DIAGNOSIS — M9905 Segmental and somatic dysfunction of pelvic region: Secondary | ICD-10-CM | POA: Diagnosis not present

## 2016-08-05 DIAGNOSIS — M9907 Segmental and somatic dysfunction of upper extremity: Secondary | ICD-10-CM | POA: Diagnosis not present

## 2016-08-05 DIAGNOSIS — M2560 Stiffness of unspecified joint, not elsewhere classified: Secondary | ICD-10-CM | POA: Diagnosis not present

## 2016-08-05 DIAGNOSIS — R293 Abnormal posture: Secondary | ICD-10-CM | POA: Diagnosis not present

## 2016-08-05 DIAGNOSIS — M25521 Pain in right elbow: Secondary | ICD-10-CM | POA: Diagnosis not present

## 2016-08-05 DIAGNOSIS — M545 Low back pain: Secondary | ICD-10-CM | POA: Diagnosis not present

## 2016-08-05 DIAGNOSIS — M9903 Segmental and somatic dysfunction of lumbar region: Secondary | ICD-10-CM | POA: Diagnosis not present

## 2016-08-05 DIAGNOSIS — G43001 Migraine without aura, not intractable, with status migrainosus: Secondary | ICD-10-CM | POA: Diagnosis not present

## 2016-08-10 DIAGNOSIS — M9903 Segmental and somatic dysfunction of lumbar region: Secondary | ICD-10-CM | POA: Diagnosis not present

## 2016-08-10 DIAGNOSIS — M9907 Segmental and somatic dysfunction of upper extremity: Secondary | ICD-10-CM | POA: Diagnosis not present

## 2016-08-10 DIAGNOSIS — M25521 Pain in right elbow: Secondary | ICD-10-CM | POA: Diagnosis not present

## 2016-08-10 DIAGNOSIS — R293 Abnormal posture: Secondary | ICD-10-CM | POA: Diagnosis not present

## 2016-08-10 DIAGNOSIS — G43001 Migraine without aura, not intractable, with status migrainosus: Secondary | ICD-10-CM | POA: Diagnosis not present

## 2016-08-10 DIAGNOSIS — M545 Low back pain: Secondary | ICD-10-CM | POA: Diagnosis not present

## 2016-08-10 DIAGNOSIS — M2560 Stiffness of unspecified joint, not elsewhere classified: Secondary | ICD-10-CM | POA: Diagnosis not present

## 2016-08-10 DIAGNOSIS — M9905 Segmental and somatic dysfunction of pelvic region: Secondary | ICD-10-CM | POA: Diagnosis not present

## 2016-08-12 DIAGNOSIS — R293 Abnormal posture: Secondary | ICD-10-CM | POA: Diagnosis not present

## 2016-08-12 DIAGNOSIS — M545 Low back pain: Secondary | ICD-10-CM | POA: Diagnosis not present

## 2016-08-12 DIAGNOSIS — M9907 Segmental and somatic dysfunction of upper extremity: Secondary | ICD-10-CM | POA: Diagnosis not present

## 2016-08-12 DIAGNOSIS — G43001 Migraine without aura, not intractable, with status migrainosus: Secondary | ICD-10-CM | POA: Diagnosis not present

## 2016-08-12 DIAGNOSIS — M9903 Segmental and somatic dysfunction of lumbar region: Secondary | ICD-10-CM | POA: Diagnosis not present

## 2016-08-12 DIAGNOSIS — M9905 Segmental and somatic dysfunction of pelvic region: Secondary | ICD-10-CM | POA: Diagnosis not present

## 2016-08-12 DIAGNOSIS — M2560 Stiffness of unspecified joint, not elsewhere classified: Secondary | ICD-10-CM | POA: Diagnosis not present

## 2016-08-12 DIAGNOSIS — M25521 Pain in right elbow: Secondary | ICD-10-CM | POA: Diagnosis not present

## 2016-08-15 DIAGNOSIS — M2560 Stiffness of unspecified joint, not elsewhere classified: Secondary | ICD-10-CM | POA: Diagnosis not present

## 2016-08-15 DIAGNOSIS — M25521 Pain in right elbow: Secondary | ICD-10-CM | POA: Diagnosis not present

## 2016-08-15 DIAGNOSIS — M9907 Segmental and somatic dysfunction of upper extremity: Secondary | ICD-10-CM | POA: Diagnosis not present

## 2016-08-15 DIAGNOSIS — M9903 Segmental and somatic dysfunction of lumbar region: Secondary | ICD-10-CM | POA: Diagnosis not present

## 2016-08-15 DIAGNOSIS — G43001 Migraine without aura, not intractable, with status migrainosus: Secondary | ICD-10-CM | POA: Diagnosis not present

## 2016-08-15 DIAGNOSIS — M9905 Segmental and somatic dysfunction of pelvic region: Secondary | ICD-10-CM | POA: Diagnosis not present

## 2016-08-15 DIAGNOSIS — M545 Low back pain: Secondary | ICD-10-CM | POA: Diagnosis not present

## 2016-08-15 DIAGNOSIS — R293 Abnormal posture: Secondary | ICD-10-CM | POA: Diagnosis not present

## 2016-08-17 DIAGNOSIS — M25521 Pain in right elbow: Secondary | ICD-10-CM | POA: Diagnosis not present

## 2016-08-17 DIAGNOSIS — G43001 Migraine without aura, not intractable, with status migrainosus: Secondary | ICD-10-CM | POA: Diagnosis not present

## 2016-08-17 DIAGNOSIS — M9907 Segmental and somatic dysfunction of upper extremity: Secondary | ICD-10-CM | POA: Diagnosis not present

## 2016-08-17 DIAGNOSIS — M2560 Stiffness of unspecified joint, not elsewhere classified: Secondary | ICD-10-CM | POA: Diagnosis not present

## 2016-08-17 DIAGNOSIS — M9905 Segmental and somatic dysfunction of pelvic region: Secondary | ICD-10-CM | POA: Diagnosis not present

## 2016-08-17 DIAGNOSIS — M9903 Segmental and somatic dysfunction of lumbar region: Secondary | ICD-10-CM | POA: Diagnosis not present

## 2016-08-17 DIAGNOSIS — R293 Abnormal posture: Secondary | ICD-10-CM | POA: Diagnosis not present

## 2016-08-17 DIAGNOSIS — M545 Low back pain: Secondary | ICD-10-CM | POA: Diagnosis not present

## 2016-08-18 DIAGNOSIS — M2560 Stiffness of unspecified joint, not elsewhere classified: Secondary | ICD-10-CM | POA: Diagnosis not present

## 2016-08-18 DIAGNOSIS — M9907 Segmental and somatic dysfunction of upper extremity: Secondary | ICD-10-CM | POA: Diagnosis not present

## 2016-08-18 DIAGNOSIS — M545 Low back pain: Secondary | ICD-10-CM | POA: Diagnosis not present

## 2016-08-18 DIAGNOSIS — R293 Abnormal posture: Secondary | ICD-10-CM | POA: Diagnosis not present

## 2016-08-18 DIAGNOSIS — M25521 Pain in right elbow: Secondary | ICD-10-CM | POA: Diagnosis not present

## 2016-08-18 DIAGNOSIS — M9905 Segmental and somatic dysfunction of pelvic region: Secondary | ICD-10-CM | POA: Diagnosis not present

## 2016-08-18 DIAGNOSIS — G43001 Migraine without aura, not intractable, with status migrainosus: Secondary | ICD-10-CM | POA: Diagnosis not present

## 2016-08-18 DIAGNOSIS — M9903 Segmental and somatic dysfunction of lumbar region: Secondary | ICD-10-CM | POA: Diagnosis not present

## 2016-08-18 MED FILL — HYDROCHLOROTHIAZIDE 25 MG T: 25 | 90 days supply | Qty: 90 | Fill #1

## 2016-08-19 MED FILL — DEXTROAMP-AMP 10 MG TAB: 10 | 30 days supply | Qty: 90 | Fill #0

## 2016-08-22 DIAGNOSIS — M25521 Pain in right elbow: Secondary | ICD-10-CM | POA: Diagnosis not present

## 2016-08-22 DIAGNOSIS — M545 Low back pain: Secondary | ICD-10-CM | POA: Diagnosis not present

## 2016-08-22 DIAGNOSIS — M9903 Segmental and somatic dysfunction of lumbar region: Secondary | ICD-10-CM | POA: Diagnosis not present

## 2016-08-22 DIAGNOSIS — M9907 Segmental and somatic dysfunction of upper extremity: Secondary | ICD-10-CM | POA: Diagnosis not present

## 2016-08-22 DIAGNOSIS — M9905 Segmental and somatic dysfunction of pelvic region: Secondary | ICD-10-CM | POA: Diagnosis not present

## 2016-08-22 DIAGNOSIS — R293 Abnormal posture: Secondary | ICD-10-CM | POA: Diagnosis not present

## 2016-08-22 DIAGNOSIS — G43001 Migraine without aura, not intractable, with status migrainosus: Secondary | ICD-10-CM | POA: Diagnosis not present

## 2016-08-22 DIAGNOSIS — M2560 Stiffness of unspecified joint, not elsewhere classified: Secondary | ICD-10-CM | POA: Diagnosis not present

## 2016-09-20 MED FILL — DEXTROAMP-AMP 10 MG TAB: 10 | 30 days supply | Qty: 90 | Fill #0

## 2016-10-19 MED FILL — DEXTROAMP-AMP 10 MG TAB: 10 | 30 days supply | Qty: 90 | Fill #0

## 2016-10-24 DIAGNOSIS — S92514A Nondisplaced fracture of proximal phalanx of right lesser toe(s), initial encounter for closed fracture: Secondary | ICD-10-CM | POA: Diagnosis not present

## 2016-11-05 ENCOUNTER — Observation Stay (HOSPITAL_COMMUNITY)
Admission: EM | Admit: 2016-11-05 | Discharge: 2016-11-07 | Disposition: A | Discharge status: Home / Self Care | Payer: 59 | Attending: Family Medicine | Admitting: Family Medicine

## 2016-11-05 ENCOUNTER — Encounter (HOSPITAL_COMMUNITY): Payer: Self-pay | Admitting: Emergency Medicine

## 2016-11-05 ENCOUNTER — Inpatient Hospital Stay (HOSPITAL_COMMUNITY): Payer: 59

## 2016-11-05 DIAGNOSIS — L03115 Cellulitis of right lower limb: Secondary | ICD-10-CM

## 2016-11-05 DIAGNOSIS — Z791 Long term (current) use of non-steroidal anti-inflammatories (NSAID): Secondary | ICD-10-CM | POA: Diagnosis not present

## 2016-11-05 DIAGNOSIS — M7989 Other specified soft tissue disorders: Secondary | ICD-10-CM | POA: Diagnosis not present

## 2016-11-05 DIAGNOSIS — E876 Hypokalemia: Secondary | ICD-10-CM | POA: Insufficient documentation

## 2016-11-05 DIAGNOSIS — Z79899 Other long term (current) drug therapy: Secondary | ICD-10-CM | POA: Insufficient documentation

## 2016-11-05 DIAGNOSIS — A419 Sepsis, unspecified organism: Secondary | ICD-10-CM | POA: Diagnosis present

## 2016-11-05 DIAGNOSIS — L03116 Cellulitis of left lower limb: Secondary | ICD-10-CM | POA: Diagnosis not present

## 2016-11-05 DIAGNOSIS — R112 Nausea with vomiting, unspecified: Secondary | ICD-10-CM | POA: Diagnosis not present

## 2016-11-05 LAB — CBC WITH DIFFERENTIAL/PLATELET
Basophils Absolute: 0 10*3/uL (ref 0.0–0.1)
Basophils Relative: 0 %
EOS PCT: 0 %
Eosinophils Absolute: 0 10*3/uL (ref 0.0–0.7)
HEMATOCRIT: 41 % (ref 36.0–46.0)
Hemoglobin: 13.9 g/dL (ref 12.0–15.0)
LYMPHS ABS: 0.5 10*3/uL — AB (ref 0.7–4.0)
LYMPHS PCT: 2 %
MCH: 27.5 pg (ref 26.0–34.0)
MCHC: 33.9 g/dL (ref 30.0–36.0)
MCV: 81.2 fL (ref 78.0–100.0)
MONO ABS: 0.5 10*3/uL (ref 0.1–1.0)
Monocytes Relative: 2 %
NEUTROS ABS: 19.1 10*3/uL — AB (ref 1.7–7.7)
Neutrophils Relative %: 96 %
PLATELETS: 259 10*3/uL (ref 150–400)
RBC: 5.05 MIL/uL (ref 3.87–5.11)
RDW: 13.8 % (ref 11.5–15.5)
WBC: 20 10*3/uL — AB (ref 4.0–10.5)

## 2016-11-05 LAB — COMPREHENSIVE METABOLIC PANEL
ALBUMIN: 3.9 g/dL (ref 3.5–5.0)
ALT: 11 U/L — ABNORMAL LOW (ref 14–54)
AST: 22 U/L (ref 15–41)
Alkaline Phosphatase: 57 U/L (ref 38–126)
Anion gap: 12 (ref 5–15)
BUN: 22 mg/dL — AB (ref 6–20)
CHLORIDE: 98 mmol/L — AB (ref 101–111)
CO2: 28 mmol/L (ref 22–32)
Calcium: 8.9 mg/dL (ref 8.9–10.3)
Creatinine, Ser: 0.78 mg/dL (ref 0.44–1.00)
GFR calc Af Amer: >60 mL/min (ref >60)
GFR calc non Af Amer: >60 mL/min (ref >60)
GLUCOSE: 103 mg/dL — AB (ref 65–99)
POTASSIUM: 3 mmol/L — AB (ref 3.5–5.1)
Sodium: 138 mmol/L (ref 135–145)
Total Bilirubin: 0.7 mg/dL (ref 0.3–1.2)
Total Protein: 6.9 g/dL (ref 6.5–8.1)

## 2016-11-05 LAB — CREATININE, SERUM: Creatinine, Ser: 0.69 mg/dL (ref 0.44–1.00)

## 2016-11-05 LAB — CBC
HEMATOCRIT: 39.6 % (ref 36.0–46.0)
HEMOGLOBIN: 13.5 g/dL (ref 12.0–15.0)
MCH: 27.6 pg (ref 26.0–34.0)
MCHC: 34.1 g/dL (ref 30.0–36.0)
MCV: 80.8 fL (ref 78.0–100.0)
Platelets: 260 10*3/uL (ref 150–400)
RBC: 4.9 MIL/uL (ref 3.87–5.11)
RDW: 13.8 % (ref 11.5–15.5)
WBC: 17.8 10*3/uL — ABNORMAL HIGH (ref 4.0–10.5)

## 2016-11-05 LAB — I-STAT CG4 LACTIC ACID, ED: LACTIC ACID, VENOUS: 2.34 mmol/L — AB (ref 0.5–1.9)

## 2016-11-05 LAB — PREGNANCY, URINE: PREG TEST UR: NEGATIVE

## 2016-11-05 LAB — MAGNESIUM: Magnesium: 1.9 mg/dL (ref 1.7–2.4)

## 2016-11-05 MED ORDER — ACETAMINOPHEN 325 MG PO TABS: 650.0000 mg | ORAL_TABLET | Freq: Four times a day (QID) | ORAL | Status: DC | PRN

## 2016-11-05 MED ORDER — SODIUM CHLORIDE 0.9 % IV SOLN
Freq: Once | INTRAVENOUS | Status: AC
  Administered 2016-11-05: 10:00:00 via INTRAVENOUS

## 2016-11-05 MED ORDER — DIPHENHYDRAMINE HCL 50 MG/ML IJ SOLN
12.5000 mg | Freq: Four times a day (QID) | INTRAMUSCULAR | Status: DC | PRN
  Administered 2016-11-05 – 2016-11-07 (×5): 12.5 mg via INTRAVENOUS
  Filled 2016-11-05 (×5): qty 1

## 2016-11-05 MED ORDER — SODIUM CHLORIDE 0.9 % IV SOLN
INTRAVENOUS | Status: AC
  Administered 2016-11-05 – 2016-11-06 (×3): via INTRAVENOUS

## 2016-11-05 MED ORDER — CEFAZOLIN SODIUM-DEXTROSE 1-4 GM/50ML-% IV SOLN
1.0000 g | Freq: Once | INTRAVENOUS | Status: AC
  Administered 2016-11-05: 1 g via INTRAVENOUS
  Filled 2016-11-05: qty 50

## 2016-11-05 MED ORDER — RISAQUAD PO CAPS
1.0000 | ORAL_CAPSULE | Freq: Every day | ORAL | Status: DC
  Administered 2016-11-06 – 2016-11-07 (×2): 1 via ORAL
  Filled 2016-11-05 (×2): qty 1

## 2016-11-05 MED ORDER — VANCOMYCIN HCL 10 G IV SOLR
1250.0000 mg | INTRAVENOUS | Status: DC
  Administered 2016-11-06 – 2016-11-07 (×2): 1250 mg via INTRAVENOUS
  Filled 2016-11-05: qty 1250
  Filled 2016-11-05: qty 1000

## 2016-11-05 MED ORDER — KETOROLAC TROMETHAMINE 15 MG/ML IJ SOLN
15.0000 mg | Freq: Four times a day (QID) | INTRAMUSCULAR | Status: DC | PRN
  Administered 2016-11-05 (×2): 15 mg via INTRAVENOUS
  Filled 2016-11-05 (×2): qty 1

## 2016-11-05 MED ORDER — AMPHETAMINE-DEXTROAMPHETAMINE 10 MG PO TABS
10.0000 mg | ORAL_TABLET | Freq: Two times a day (BID) | ORAL | Status: DC
  Administered 2016-11-06 – 2016-11-07 (×4): 10 mg via ORAL
  Filled 2016-11-05 (×4): qty 1

## 2016-11-05 MED ORDER — PROMETHAZINE HCL 25 MG/ML IJ SOLN
6.2500 mg | INTRAMUSCULAR | Status: DC | PRN
  Filled 2016-11-05: qty 1

## 2016-11-05 MED ORDER — ENOXAPARIN SODIUM 40 MG/0.4ML ~~LOC~~ SOLN
40.0000 mg | SUBCUTANEOUS | Status: DC
  Administered 2016-11-06: 40 mg via SUBCUTANEOUS
  Filled 2016-11-05 (×2): qty 0.4

## 2016-11-05 MED ORDER — CEFAZOLIN SODIUM-DEXTROSE 1-4 GM/50ML-% IV SOLN
1.0000 g | Freq: Three times a day (TID) | INTRAVENOUS | Status: DC
  Administered 2016-11-05: 1 g via INTRAVENOUS
  Filled 2016-11-05: qty 50

## 2016-11-05 MED ORDER — ENOXAPARIN SODIUM 40 MG/0.4ML ~~LOC~~ SOLN: 40.0000 mg | SUBCUTANEOUS | Status: DC

## 2016-11-05 MED ORDER — CEFAZOLIN SODIUM-DEXTROSE 2-4 GM/100ML-% IV SOLN
2.0000 g | Freq: Three times a day (TID) | INTRAVENOUS | Status: DC
  Administered 2016-11-06 – 2016-11-07 (×5): 2 g via INTRAVENOUS
  Filled 2016-11-05 (×5): qty 100

## 2016-11-05 MED ORDER — SODIUM CHLORIDE 0.9 % IV BOLUS (SEPSIS)
1000.0000 mL | Freq: Once | INTRAVENOUS | Status: AC
  Administered 2016-11-05: 1000 mL via INTRAVENOUS

## 2016-11-05 MED ORDER — MORPHINE SULFATE (PF) 4 MG/ML IV SOLN: 1.0000 mg | INTRAVENOUS | Status: DC | PRN

## 2016-11-05 MED ORDER — POTASSIUM CHLORIDE 10 MEQ/100ML IV SOLN
10.0000 meq | INTRAVENOUS | Status: AC
  Administered 2016-11-05 (×3): 10 meq via INTRAVENOUS
  Filled 2016-11-05 (×3): qty 100

## 2016-11-05 MED ORDER — GADOBENATE DIMEGLUMINE 529 MG/ML IV SOLN
10.0000 mL | Freq: Once | INTRAVENOUS | Status: AC | PRN
  Administered 2016-11-05: 10 mL via INTRAVENOUS

## 2016-11-05 MED ORDER — OXYMETAZOLINE HCL 0.05 % NA SOLN
1.0000 | Freq: Two times a day (BID) | NASAL | Status: DC
  Administered 2016-11-05 – 2016-11-06 (×3): 1 via NASAL
  Filled 2016-11-05: qty 15

## 2016-11-05 MED ORDER — MORPHINE SULFATE (PF) 2 MG/ML IV SOLN: 1.0000 mg | INTRAVENOUS | Status: DC | PRN

## 2016-11-05 MED ORDER — VANCOMYCIN HCL IN DEXTROSE 1-5 GM/200ML-% IV SOLN: 1000.0000 mg | INTRAVENOUS | Status: DC

## 2016-11-05 MED ORDER — INFLUENZA VAC SPLIT QUAD 0.5 ML IM SUSY: 0.5000 mL | PREFILLED_SYRINGE | INTRAMUSCULAR | Status: DC | PRN

## 2016-11-05 MED ORDER — ONDANSETRON HCL 4 MG/2ML IJ SOLN
4.0000 mg | Freq: Once | INTRAMUSCULAR | Status: AC
  Administered 2016-11-05: 4 mg via INTRAVENOUS
  Filled 2016-11-05: qty 2

## 2016-11-05 MED ORDER — VANCOMYCIN HCL IN DEXTROSE 1-5 GM/200ML-% IV SOLN
1000.0000 mg | Freq: Once | INTRAVENOUS | Status: AC
  Administered 2016-11-05: 1000 mg via INTRAVENOUS
  Filled 2016-11-05: qty 200

## 2016-11-05 NOTE — Consult Note (Signed)
Michaela Oris, DO Chief Complaint: Right foot/ankle pain/swelling History: Michaela Bryant dam is a 41 y.o. female  With history of celiac disease presented to emergency room due to right foot pain and feeling nauseous/ being weak.  She broke her right fifth toe 2 weeks ago.  Has been able to ambulate without difficulty and has returned to her exercise routine.  However, 3 days ago, she attempted to get running, and developed nausea after running with progressive weakness and occasional chills.  She noticed small amount of redness of her right foot last night.  This morning the redness extended to the right lateral foot to her right ankle, right leg and up to right inner thigh.  She reports some numbness and tingling to  right toes.  ED course : She has no fever ,no sinus tachycardia, no hypoxia, blood pressure low normal.  Labs with leukocytosis WBC 20, lactic acid 2.3, potassium 3, BUN 22 creatinine 0.78.  Blood culture obtained in the ED, she is started IV hydration, she is given Vanco and Ancef.   History reviewed. No pertinent past medical history.  Allergies  Allergen Reactions  . Gluten Meal Nausea Only    No current facility-administered medications on file prior to encounter.    Current Outpatient Prescriptions on File Prior to Encounter  Medication Sig Dispense Refill  . hydrochlorothiazide (MICROZIDE) 12.5 MG capsule TAKE 1 CAPSULE BY MOUTH DAILY IN THE MORNING AS NEEDED FOR SWELLING/FLUID  0  . ibuprofen (ADVIL,MOTRIN) 200 MG tablet Take 800 mg by mouth every 8 (eight) hours as needed for headache or mild pain.       Physical Exam: Vitals:   11/05/16 1100 11/05/16 1230  BP: 106/67 107/62  Pulse: 84 80  Resp: (!) 21 17  Temp:  98.3 F (36.8 C)  SpO2: 100% 100%  A+O X3 No sob/cp Abd soft/NT R LE:  compartments in calf and foot soft/NT  No pain with passive movement of foot/toes  2+ DP/PT pulses  EHL/TA/GA intact   Sensation to light touch intact  Mild erythema  noted over dorsum of the ankle and foot  No knee/hip pain with ROM  Mild pain with palpation over 5th toe  Mild swelling of foot noted  No significant swelling of the ankle  No obvious laceration or abrasion to the foot/ankle Denies dysuria   Image: Mr Foot Right W Wo Contrast  Result Date: 11/05/2016 CLINICAL DATA:  Prior small toe fracture, with new onset of swelling, pain, and erythema extending from the toes up into the ankle and distal calf. EXAM: MRI OF THE RIGHT ANKLE WITHOUT AND WITH CONTRAST; MRI OF THE RIGHT FOREFOOT WITHOUT AND WITH CONTRAST TECHNIQUE: Multiplanar, multisequence MR imaging of the ankle was performed before and after the administration of intravenous contrast. CONTRAST:  42m MULTIHANCE GADOBENATE DIMEGLUMINE 529 MG/ML IV SOLN COMPARISON:  None. FINDINGS: MRI FOREFOOT: This a known fracture the proximal phalanx of the small toe with associated surrounding edema for example on image 10/8. There may be some low-level edema in the head of the fifth metatarsal on image 3/8 is well, but given the recent trauma in this vicinity I am skeptical of osteomyelitis. Conventional radiography has reportedly been performed at the outside orthopedist office, and a review to confirm that there is not a subtle fracture of the distal fifth metatarsal would be suggested, but in general I am skeptical of a fifth metatarsal fracture. The remaining toes appear unremarkable. There is no significant joint effusion. Lisfranc ligament intact.  The metatarsals and first digit sesamoids appear otherwise normal. No significant abnormal osseous edema in the midfoot. There is subcutaneous edema tracking along the foot dorsally and also with some extension into the second through fifth toes. Subtle edema also tracks between the adductor hallucis oblique head in the metatarsals as shown on image 26/5, with only minimal associated enhancement in this vicinity on image 26/16 may well be reactive. There is no  drainable abscess or gas tracking in the soft tissues. MRI HINDFOOT/ANKLE: TENDONS Peroneal: Unremarkable Posteromedial: Mild distal tibialis posterior tenosynovitis. Anterior: Unremarkable Achilles: Unremarkable Plantar Fascia: Unremarkable LIGAMENTS Lateral: Unremarkable Medial: Unremarkable CARTILAGE Ankle Joint: Unremarkable Subtalar Joints/Sinus Tarsi: Unremarkable Bones: Unremarkable Other: Subcutaneous edema and enhancement tracking along the dorsum of the foot and around the lateral ankle and posterior ankle, and to a lesser extent the medial ankle, and into the calf. IMPRESSION: IMPRESSION 1. The abnormal subcutaneous edema and enhancement tracking in the lateral four toes, in the dorsum of the forefoot, and tracking around the ankle and into the calf, suspicious for cellulitis. Reflex sympathetic dystrophy would be a less likely differential diagnostic consideration. 2. There is some trace enhancement and edema along the plantar musculature of the foot, specifically between the adductor hallucis oblique head and the metatarsals/interosseous muscles. This is most likely reactive and less likely to represent a low-grade fasciitis. No gas is present in the soft tissues. 3. No compelling findings of osteomyelitis. No drainable abscess. Abnormal edema in the proximal phalanx small toe is attributable to the known fracture. There is some subtle edema in the head of the fifth metatarsal which also could be posttraumatic. Node joint effusion to suggest septic joint. 4. Mild distal tibialis posterior tenosynovitis. Electronically Signed   By: Van Clines M.D.   On: 11/05/2016 17:10   Mr Ankle Right W Wo Contrast  Result Date: 11/05/2016 CLINICAL DATA:  Prior small toe fracture, with new onset of swelling, pain, and erythema extending from the toes up into the ankle and distal calf. EXAM: MRI OF THE RIGHT ANKLE WITHOUT AND WITH CONTRAST; MRI OF THE RIGHT FOREFOOT WITHOUT AND WITH CONTRAST TECHNIQUE:  Multiplanar, multisequence MR imaging of the ankle was performed before and after the administration of intravenous contrast. CONTRAST:  72m MULTIHANCE GADOBENATE DIMEGLUMINE 529 MG/ML IV SOLN COMPARISON:  None. FINDINGS: MRI FOREFOOT: This a known fracture the proximal phalanx of the small toe with associated surrounding edema for example on image 10/8. There may be some low-level edema in the head of the fifth metatarsal on image 3/8 is well, but given the recent trauma in this vicinity I am skeptical of osteomyelitis. Conventional radiography has reportedly been performed at the outside orthopedist office, and a review to confirm that there is not a subtle fracture of the distal fifth metatarsal would be suggested, but in general I am skeptical of a fifth metatarsal fracture. The remaining toes appear unremarkable. There is no significant joint effusion. Lisfranc ligament intact. The metatarsals and first digit sesamoids appear otherwise normal. No significant abnormal osseous edema in the midfoot. There is subcutaneous edema tracking along the foot dorsally and also with some extension into the second through fifth toes. Subtle edema also tracks between the adductor hallucis oblique head in the metatarsals as shown on image 26/5, with only minimal associated enhancement in this vicinity on image 26/16 may well be reactive. There is no drainable abscess or gas tracking in the soft tissues. MRI HINDFOOT/ANKLE: TENDONS Peroneal: Unremarkable Posteromedial: Mild distal tibialis posterior tenosynovitis. Anterior: Unremarkable  Achilles: Unremarkable Plantar Fascia: Unremarkable LIGAMENTS Lateral: Unremarkable Medial: Unremarkable CARTILAGE Ankle Joint: Unremarkable Subtalar Joints/Sinus Tarsi: Unremarkable Bones: Unremarkable Other: Subcutaneous edema and enhancement tracking along the dorsum of the foot and around the lateral ankle and posterior ankle, and to a lesser extent the medial ankle, and into the calf.  IMPRESSION: IMPRESSION 1. The abnormal subcutaneous edema and enhancement tracking in the lateral four toes, in the dorsum of the forefoot, and tracking around the ankle and into the calf, suspicious for cellulitis. Reflex sympathetic dystrophy would be a less likely differential diagnostic consideration. 2. There is some trace enhancement and edema along the plantar musculature of the foot, specifically between the adductor hallucis oblique head and the metatarsals/interosseous muscles. This is most likely reactive and less likely to represent a low-grade fasciitis. No gas is present in the soft tissues. 3. No compelling findings of osteomyelitis. No drainable abscess. Abnormal edema in the proximal phalanx small toe is attributable to the known fracture. There is some subtle edema in the head of the fifth metatarsal which also could be posttraumatic. Node joint effusion to suggest septic joint. 4. Mild distal tibialis posterior tenosynovitis. Electronically Signed   By: Van Clines M.D.   On: 11/05/2016 17:10    A/P: Patient with increasing right foot/ankle pain, and erythema for 24 hrs.  MRI is unremarkable.  Clinical exam not consistent with septic joint or compartment syndrome.  I did discuss case with Dr Doran Durand who also reviewed MRI.  Agree with plan of immobilization and elevation for comfort.  Will re-evaluate in AM.  Monitor WBC. Continue IV abx for cellulitis.

## 2016-11-05 NOTE — ED Notes (Signed)
Dr Ralene Bathe and Ali Lowe aware of lactic acid 2.34

## 2016-11-05 NOTE — H&P (Signed)
History and Physical  Michaela Bryant dam IHW:[id number] DOB: 09-Oct-1975 DOA: 11/05/2016  Referring physician: EDP PCP: Hunt Oris, DO   Chief Complaint: right Foot pain, sepsis  HPI: Michaela Bryant dam is a 41 y.o. female  With history of celiac disease presented to emergency room due to right foot pain and feeling nauseous/ being weak.  She broke her right fifth toe 2 weeks ago.  Has been able to ambulate without difficulty and has returned to her exercise routine.  However, 3 days ago, she attempted to get running, and developed nausea after running with progressive weakness and occasional chills.  She noticed small amount of redness of her right foot last night.  This morning the redness extended to the right lateral foot to her right ankle, right leg and up to right inner thigh.  She reports some numbness and tingling to  right toes.  ED course : She has no fever ,no sinus tachycardia, no hypoxia, blood pressure low normal.  Labs with leukocytosis WBC 20, lactic acid 2.3, potassium 3, BUN 22 creatinine 0.78.  Blood culture obtained in the ED, she is started IV hydration, she is given Vanco and Ancef.  MRI foot and ankle is pending.  Hospitalist called to admit the patient.  She otherwise denies of headache, + nausea,  no vomiting, no chest pain or short of breath, no abdominal pain, no constipation ,no diarrhea ,no dysuria.   Review of Systems:  Detail per HPI, Review of systems are otherwise negative  History reviewed. No pertinent past medical history. History reviewed. No pertinent surgical history. Social History:  reports that she has never smoked. She has never used smokeless tobacco. She reports that she drinks alcohol. She reports that she does not use drugs. Patient lives at home & is able to participate in activities of daily living independently   Allergies  Allergen Reactions  . Gluten Meal Nausea Only    No family history on file.    Prior to Admission  medications   Medication Sig Start Date End Date Taking? Authorizing Provider  acetaminophen (TYLENOL) 500 MG tablet Take 1,000 mg by mouth every 6 (six) hours as needed for mild pain or headache.   Yes [provider]  acidophilus (RISAQUAD) CAPS capsule Take 1 capsule by mouth daily.   Yes [provider]  amphetamine-dextroamphetamine (ADDERALL) 10 MG tablet Take 10 mg by mouth 2 (two) times daily.   Yes [provider]  hydrochlorothiazide (MICROZIDE) 12.5 MG capsule TAKE 1 CAPSULE BY MOUTH DAILY IN THE MORNING AS NEEDED FOR SWELLING/FLUID 07/03/15  Yes [provider]  ibuprofen (ADVIL,MOTRIN) 200 MG tablet Take 800 mg by mouth every 8 (eight) hours as needed for headache or mild pain.    Yes [provider]  naproxen sodium (ALEVE) 220 MG tablet Take 220 mg by mouth 2 (two) times daily as needed (pain, body aches).   Yes [provider]    Physical Exam: BP 110/84   Pulse 84   Temp 98.3 F (36.8 C) (Oral)   Resp 18   SpO2 100%   General:  weak Eyes: PERRL ENT: unremarkable Neck: supple, no JVD Cardiovascular: RRR Respiratory: CTABL Abdomen: soft/ND/ND, positive bowel sounds Skin: erythema right lateral foot ankle, erythematous tracking over the right anterior tibia  as well as  right upper medial thigh Musculoskeletal: Right foot edematous, erythematous, tender, peripheral pulses intact, decreased range of motion at right ankle due to pain.  No popliteal or inguinal lymphadenopathy Psychiatric: calm/cooperative  Neurologic: no focal findings            Labs on Admission:  Basic Metabolic Panel:  Recent Labs Lab 11/05/16 0821  NA 138  K 3.0*  CL 98*  CO2 28  GLUCOSE 103*  BUN 22*  CREATININE 0.78  CALCIUM 8.9   Liver Function Tests:  Recent Labs Lab 11/05/16 0821  AST 22  ALT 11*  ALKPHOS 57  BILITOT 0.7  PROT 6.9  ALBUMIN 3.9   No results for input(s): LIPASE, AMYLASE in the last 168 hours. No  results for input(s): AMMONIA in the last 168 hours. CBC:  Recent Labs Lab 11/05/16 0821  WBC 20.0*  NEUTROABS 19.1*  HGB 13.9  HCT 41.0  MCV 81.2  PLT 259   Cardiac Enzymes: No results for input(s): CKTOTAL, CKMB, CKMBINDEX, TROPONINI in the last 168 hours.  BNP (last 3 results) No results for input(s): BNP in the last 8760 hours.  ProBNP (last 3 results) No results for input(s): PROBNP in the last 8760 hours.  CBG: No results for input(s): GLUCAP in the last 168 hours.  Radiological Exams on Admission: No results found.    Assessment/Plan Present on Admission: . Sepsis ([place])  Sepsis with right foot cellulitis/lymphagiitis Blood culture obtained in the ED, MRI pending, will get ESR CRP Continue Vanco Ancef, IVF, PRN analgesic and antiemetics Currently n.p.o.  Hypokalemia Replace K check mag  DVT prophylaxis: hold lovenox for now in case of needing surgery, not able to apply scd to right leg due to leg pain  Consultants: none  Code Status: full   Family Communication:  Patient and husband Dr Tommy Medal   Disposition Plan: admit to med surg  Time spent: 40mns  Damian Buckles MD, PhD [facility] Pager [phone] 7PM-7AM, please contact night-coverage at [email or web address], password [email or web address]

## 2016-11-05 NOTE — ED Notes (Signed)
Bed: RP59 Expected date:  Expected time:  Means of arrival:  Comments: sepsis

## 2016-11-05 NOTE — Progress Notes (Addendum)
Pharmacy Antibiotic Note  Michaela Bryant dam is a 41 y.o. female admitted on 11/05/2016 with sepsis secondary to right foot cellulitis.  Pharmacy has been consulted for Vancomycin and Cefazolin dosing. First doses already given in the ED.  Plan: Vancomycin 1237m IV q24h to start in AM for estimated AUC of 483. Plan for Vancomycin levels at steady state, as indicated. Cefazolin 1g IV q8h. Monitor renal function, cultures, clinical course.    Height: 5' 5"  (165.1 cm) Weight: 115 lb (52.2 kg) IBW/kg (Calculated) : 57  Temp (24hrs), Avg:98.3 F (36.8 C), Min:98.2 F (36.8 C), Max:98.3 F (36.8 C)   Recent Labs Lab 11/05/16 0821 11/05/16 0933 11/05/16 1234  WBC 20.0*  --  17.8*  CREATININE 0.78  --  0.69  LATICACIDVEN  --  2.34*  --     Estimated Creatinine Clearance: 74.7 mL/min (by C-G formula based on SCr of 0.69 mg/dL).    Allergies  Allergen Reactions  . Gluten Meal Nausea Only    Antimicrobials this admission: 11/3 >> Vancomycin >> 11/3 >> Cefazolin >>  Dose adjustments this admission: --  Microbiology results: 11/3 BCx: sent 11/3 HIV antibody: IP  Thank you for allowing pharmacy to be a part of this patient's care.   JLindell Spar PharmD, BCPS Pager: [phone]/03/2016 1:05 PM    Addendum:   Per discussion with MD, adjust Cefazolin to 2g IV q8h for rule out bacteremia.   JLindell Spar PharmD, BCPS Pager: [phone]/03/2016 6:02 PM

## 2016-11-05 NOTE — ED Provider Notes (Signed)
[facility] Provider Note   CSN: [id number] Arrival date & time: 11/05/16  1062     History   Chief Complaint Chief Complaint  Patient presents with  . Foot Pain  . Nausea    HPI Michaela Bryant dam is a 41 y.o. female.  The history is provided by the patient. No language interpreter was used.  Foot Pain     Michaela Bryant dam is a 41 y.o. female who presents to the Emergency Department complaining of foot pain, nausea.  Several weeks ago she broke her right fifth toe.  She has been able to ambulate without difficulty and has been returning to her exercise routine.  3 days ago she attempted to get running.  After running she developed nausea.  She has progressively worsened with occasional chills and worsening nausea.  She noted a small amount of redness on her foot last night.  This morning the redness extended up the foot and up her leg with associated itching.  No fevers, vomiting.  History reviewed. No pertinent past medical history.  Patient Active Problem List   Diagnosis Date Noted  . Sepsis ([place]) 11/05/2016    History reviewed. No pertinent surgical history.  OB History    No data available       Home Medications    Prior to Admission medications   Medication Sig Start Date End Date Taking? Authorizing Provider  acetaminophen (TYLENOL) 500 MG tablet Take 1,000 mg by mouth every 6 (six) hours as needed for mild pain or headache.   Yes [provider]  acidophilus (RISAQUAD) CAPS capsule Take 1 capsule by mouth daily.   Yes [provider]  amphetamine-dextroamphetamine (ADDERALL) 10 MG tablet Take 10 mg by mouth 2 (two) times daily.   Yes [provider]  hydrochlorothiazide (MICROZIDE) 12.5 MG capsule TAKE 1 CAPSULE BY MOUTH DAILY IN THE MORNING AS NEEDED FOR SWELLING/FLUID 07/03/15  Yes [provider]  ibuprofen (ADVIL,MOTRIN) 200 MG tablet Take 800 mg by mouth every 8 (eight) hours as needed  for headache or mild pain.    Yes [provider]  naproxen sodium (ALEVE) 220 MG tablet Take 220 mg by mouth 2 (two) times daily as needed (pain, body aches).   Yes [provider]    Family History No family history on file.  Social History Social History  Substance Use Topics  . Smoking status: Never Smoker  . Smokeless tobacco: Never Used  . Alcohol use Yes     Comment: occasionally      Allergies   Gluten meal   Review of Systems Review of Systems  All other systems reviewed and are negative.    Physical Exam Updated Vital Signs BP 107/62 (BP Location: Right Arm)   Pulse 80   Temp 98.3 F (36.8 C) (Oral)   Resp 17   Ht 5' 5"  (1.651 m)   Wt 52.2 kg (115 lb)   SpO2 100%   BMI 19.14 kg/m   Physical Exam  Constitutional: She is oriented to person, place, and time. She appears well-developed and well-nourished.  HENT:  Head: Normocephalic and atraumatic.  Cardiovascular: Normal rate and regular rhythm.   No murmur heard. Pulmonary/Chest: Effort normal and breath sounds normal. No respiratory distress.  Abdominal: Soft.  Mild lower abdominal tenderness without guarding or rebound  Musculoskeletal:  2+ DP pulses bilaterally.  Erythema and edema to the right dorsal midfoot and ankle.  There is erythematous streaking over the right  anterior tibia/calf as well as the right medial thigh.  No popliteal or inguinal lymphadenopathy.  There is mild to moderate right mid foot tenderness to palpation  Neurological: She is alert and oriented to person, place, and time.  Skin: Skin is warm and dry.  Psychiatric: She has a normal mood and affect. Her behavior is normal.  Nursing note and vitals reviewed.    ED Treatments / Results  Labs (all labs ordered are listed, but only abnormal results are displayed) Labs Reviewed  COMPREHENSIVE METABOLIC PANEL - Abnormal; Notable for the following:       Result Value   Potassium 3.0 (*)    Chloride 98 (*)     Glucose, Bld 103 (*)    BUN 22 (*)    ALT 11 (*)    All other components within normal limits  CBC WITH DIFFERENTIAL/PLATELET - Abnormal; Notable for the following:    WBC 20.0 (*)    Neutro Abs 19.1 (*)    Lymphs Abs 0.5 (*)    All other components within normal limits  CBC - Abnormal; Notable for the following:    WBC 17.8 (*)    All other components within normal limits  I-STAT CG4 LACTIC ACID, ED - Abnormal; Notable for the following:    Lactic Acid, Venous 2.34 (*)    All other components within normal limits  CULTURE, BLOOD (ROUTINE X 2)  CULTURE, BLOOD (ROUTINE X 2)  CREATININE, SERUM  MAGNESIUM  PREGNANCY, URINE  HIV ANTIBODY (ROUTINE TESTING)  PROTIME-INR  CBC  COMPREHENSIVE METABOLIC PANEL  LACTIC ACID, PLASMA  SEDIMENTATION RATE  C-REACTIVE PROTEIN  POC URINE PREG, ED  I-STAT CG4 LACTIC ACID, ED    EKG  EKG Interpretation None       Radiology No results found.  Procedures Procedures (including critical care time)  Medications Ordered in ED Medications  0.9 %  sodium chloride infusion ( Intravenous Restarted 11/05/16 1204)  ketorolac (TORADOL) 15 MG/ML injection 15 mg (15 mg Intravenous Given 11/05/16 1122)  promethazine (PHENERGAN) injection 6.25 mg (not administered)  acetaminophen (TYLENOL) tablet 650 mg (not administered)  acidophilus (RISAQUAD) capsule 1 capsule (1 capsule Oral Not Given 11/05/16 1233)  amphetamine-dextroamphetamine (ADDERALL) tablet 10 mg (10 mg Oral Not Given 11/05/16 1400)  enoxaparin (LOVENOX) injection 40 mg (not administered)  oxymetazoline (AFRIN) 0.05 % nasal spray 1 spray (1 spray Each Nare Not Given 11/05/16 1233)  diphenhydrAMINE (BENADRYL) injection 12.5 mg (12.5 mg Intravenous Given 11/05/16 1210)  ceFAZolin (ANCEF) IVPB 1 g/50 mL premix (not administered)  morphine 4 MG/ML injection 1 mg (not administered)  vancomycin (VANCOCIN) 1,250 mg in sodium chloride 0.9 % 250 mL IVPB (not administered)  sodium chloride 0.9 %  bolus 1,000 mL (0 mLs Intravenous Stopped 11/05/16 0932)  vancomycin (VANCOCIN) IVPB 1000 mg/200 mL premix (0 mg Intravenous Stopped 11/05/16 1148)  ceFAZolin (ANCEF) IVPB 1 g/50 mL premix (0 g Intravenous Stopped 11/05/16 0916)  ondansetron (ZOFRAN) injection 4 mg (4 mg Intravenous Given 11/05/16 0938)  ceFAZolin (ANCEF) IVPB 1 g/50 mL premix (0 g Intravenous Stopped 11/05/16 1041)  0.9 %  sodium chloride infusion ( Intravenous Stopped 11/05/16 1205)  potassium chloride 10 mEq in 100 mL IVPB (0 mEq Intravenous Stopped 11/05/16 1502)  gadobenate dimeglumine (MULTIHANCE) injection 10 mL (10 mLs Intravenous Contrast Given 11/05/16 1644)     Initial Impression / Assessment and Plan / ED Course  I have reviewed the triage vital signs and the nursing notes.  Pertinent labs & imaging  results that were available during my care of the patient were reviewed by me and considered in my medical decision making (see chart for details).     Patient here for progressive pain and swelling to the right foot, streaking up the leg.  Examination is consistent with cellulitis with ascending lymphangitis.  Given her systemic symptoms and rapid progression she was started on IV antibiotics and IV fluids.  Hospitalist consulted for admission for further treatment.  Final Clinical Impressions(s) / ED Diagnoses   Final diagnoses:  Cellulitis of right lower extremity    New Prescriptions Current Discharge Medication List       Quintella Reichert, MD 11/05/16 1705

## 2016-11-05 NOTE — ED Notes (Signed)
0.9 NS IV infusion stopped, will resume infusion after Vanc and K or finished.

## 2016-11-05 NOTE — Progress Notes (Signed)
Pharmacy Note   A consult was received from an ED physician for vancomycin per pharmacy dosing.   The patient's profile has been reviewed for ht/wt/allergies/indication/available labs.    A one time order has been placed for vancomycin 1000 mg IV x 1.  Further antibiotics/pharmacy consults should be ordered by admitting physician if indicated.                       Thank you,  Royetta Asal, PharmD, BCPS Pager [phone] 11/05/2016 7:44 AM

## 2016-11-05 NOTE — ED Notes (Signed)
Attempted to call [facility]. RN said RN will call ED when RN is ready to receive report.

## 2016-11-05 NOTE — ED Notes (Signed)
ED Provider at bedside. 

## 2016-11-05 NOTE — ED Triage Notes (Signed)
Patient here from home with complaints of right foot pain. Reports that she broke her foot 2 weeks ago. Increased redness and inflammation to right ankle and calf. Nausea.

## 2016-11-05 NOTE — ED Notes (Signed)
URINE REQUEST HAS BEEN MADE

## 2016-11-06 ENCOUNTER — Inpatient Hospital Stay (HOSPITAL_COMMUNITY): Payer: 59

## 2016-11-06 DIAGNOSIS — Z791 Long term (current) use of non-steroidal anti-inflammatories (NSAID): Secondary | ICD-10-CM | POA: Diagnosis not present

## 2016-11-06 DIAGNOSIS — L03115 Cellulitis of right lower limb: Secondary | ICD-10-CM | POA: Diagnosis not present

## 2016-11-06 DIAGNOSIS — I509 Heart failure, unspecified: Secondary | ICD-10-CM

## 2016-11-06 DIAGNOSIS — L03116 Cellulitis of left lower limb: Secondary | ICD-10-CM | POA: Diagnosis not present

## 2016-11-06 DIAGNOSIS — Z79899 Other long term (current) drug therapy: Secondary | ICD-10-CM | POA: Diagnosis not present

## 2016-11-06 DIAGNOSIS — E876 Hypokalemia: Secondary | ICD-10-CM

## 2016-11-06 DIAGNOSIS — M7989 Other specified soft tissue disorders: Secondary | ICD-10-CM | POA: Diagnosis not present

## 2016-11-06 LAB — CBC
HEMATOCRIT: 32.7 % — AB (ref 36.0–46.0)
Hemoglobin: 11 g/dL — ABNORMAL LOW (ref 12.0–15.0)
MCH: 27.4 pg (ref 26.0–34.0)
MCHC: 33.6 g/dL (ref 30.0–36.0)
MCV: 81.3 fL (ref 78.0–100.0)
Platelets: 213 10*3/uL (ref 150–400)
RBC: 4.02 MIL/uL (ref 3.87–5.11)
RDW: 14.1 % (ref 11.5–15.5)
WBC: 10.1 10*3/uL (ref 4.0–10.5)

## 2016-11-06 LAB — COMPREHENSIVE METABOLIC PANEL
ALBUMIN: 2.8 g/dL — AB (ref 3.5–5.0)
ALT: 11 U/L — ABNORMAL LOW (ref 14–54)
ANION GAP: 6 (ref 5–15)
AST: 22 U/L (ref 15–41)
Alkaline Phosphatase: 50 U/L (ref 38–126)
BILIRUBIN TOTAL: 0.7 mg/dL (ref 0.3–1.2)
BUN: 23 mg/dL — ABNORMAL HIGH (ref 6–20)
CO2: 25 mmol/L (ref 22–32)
Calcium: 8 mg/dL — ABNORMAL LOW (ref 8.9–10.3)
Chloride: 107 mmol/L (ref 101–111)
Creatinine, Ser: 0.67 mg/dL (ref 0.44–1.00)
GLUCOSE: 94 mg/dL (ref 65–99)
POTASSIUM: 3.3 mmol/L — AB (ref 3.5–5.1)
Sodium: 138 mmol/L (ref 135–145)
TOTAL PROTEIN: 5.4 g/dL — AB (ref 6.5–8.1)

## 2016-11-06 LAB — PROTIME-INR
INR: 1.12
Prothrombin Time: 14.4 seconds (ref 11.4–15.2)

## 2016-11-06 LAB — SEDIMENTATION RATE: SED RATE: 32 mm/h — AB (ref 0–22)

## 2016-11-06 LAB — HIV ANTIBODY (ROUTINE TESTING W REFLEX): HIV Screen 4th Generation wRfx: NONREACTIVE

## 2016-11-06 LAB — LACTIC ACID, PLASMA: LACTIC ACID, VENOUS: 0.8 mmol/L (ref 0.5–1.9)

## 2016-11-06 LAB — C-REACTIVE PROTEIN: CRP: 20 mg/dL — AB (ref <1.0)

## 2016-11-06 LAB — ECHOCARDIOGRAM COMPLETE
HEIGHTINCHES: 65 in
WEIGHTICAEL: 1728 [oz_av]

## 2016-11-06 MED ORDER — POTASSIUM CHLORIDE CRYS ER 20 MEQ PO TBCR
40.0000 meq | EXTENDED_RELEASE_TABLET | Freq: Once | ORAL | Status: AC
  Administered 2016-11-06: 40 meq via ORAL
  Filled 2016-11-06: qty 2

## 2016-11-06 MED ORDER — FUROSEMIDE 10 MG/ML IJ SOLN
20.0000 mg | Freq: Once | INTRAMUSCULAR | Status: AC
  Administered 2016-11-06: 20 mg via INTRAVENOUS
  Filled 2016-11-06: qty 2

## 2016-11-06 NOTE — Progress Notes (Signed)
  Echocardiogram 2D Echocardiogram has been performed.  Sulema Braid T Hearl Heikes 11/06/2016, 2:07 PM

## 2016-11-06 NOTE — Progress Notes (Signed)
Subjective: Cellulitis RLE Reports ongoing pain, swelling R foot. Maybe slightly better this AM. Chills are gone. No other c/o. No fever or chills.    Objective: Vital signs in last 24 hours: Temp:  [98.1 F (36.7 C)-98.3 F (36.8 C)] 98.1 F (36.7 C) (11/04 0600) Pulse Rate:  [80-90] 81 (11/04 0600) Resp:  [16-17] 16 (11/04 0600) BP: (96-107)/(59-62) 96/59 (11/04 0600) SpO2:  [97 %-100 %] 98 % (11/04 0600) Weight:  [49 kg (108 lb)-52.2 kg (115 lb)] 49 kg (108 lb) (11/03 1433)  Intake/Output from previous day: 11/03 0701 - 11/04 0700 In: 3258.8 [I.V.:1758.8; IV Piggyback:1500] Out: 200 [Urine:200] Intake/Output this shift: Total I/O In: 120 [P.O.:120] Out: -   Recent Labs    11/05/16 0821 11/05/16 1234 11/06/16 0435  HGB 13.9 13.5 11.0*   Recent Labs    11/05/16 1234 11/06/16 0435  WBC 17.8* 10.1  RBC 4.90 4.02  HCT 39.6 32.7*  PLT 260 213   Recent Labs    11/05/16 0821 11/05/16 1234 11/06/16 0435  NA 138  --  138  K 3.0*  --  3.3*  CL 98*  --  107  CO2 28  --  25  BUN 22*  --  23*  CREATININE 0.78 0.69 0.67  GLUCOSE 103*  --  94  CALCIUM 8.9  --  8.0*   Recent Labs    11/06/16 0435  INR 1.12    Neurologically intact ABD soft Neurovascular intact Sensation intact distally Intact pulses distally Dorsiflexion/Plantar flexion intact Incision: dressing C/D/I and no drainage No cellulitis present Compartment soft no calf pain   Assessment/Plan: RLE cellulitis, on abx per admitting service MRI with no evidence of septic joint Continue to watch WBC WNL 10.1 today   BISSELL, JACLYN M. 11/06/2016, 11:09 AM

## 2016-11-06 NOTE — Progress Notes (Signed)
[facility]  PROGRESS NOTE  Michaela Bryant dam ONG:[id number] DOB: [birth year] DOA: 11/05/2016 PCP: Hunt Oris, DO   Brief HPI:   41 y.o. female with history of celiac disease presented to emergency room due to right foot pain and feeling nauseous/ being weak.  She broke her right fifth toe 2 weeks ago.  Has been able to ambulate without difficulty and has returned to her exercise routine.  However, 3 days ago, she attempted to get running, and developed nausea after running with progressive weakness and occasional chills.  She noticed small amount of redness of her right foot last night.  This morning the redness extended to the right lateral foot to her right ankle, right leg and up to right inner thigh.  She reports some numbness and tingling to  right toes.     Subjective   Michaela Bryant seen and examined, continues to have right ankle swelling but erythema has improved.  Denies pain at this time.  MRI of the foot showed no osteomyelitis.  Appreciate orthopedics input.   Assessment/Plan:     1. Right ankle cellulitis-improving, Michaela Bryant has been afebrile.  WBCs down to 10.1.  Pain is well controlled.  Blood cultures x2 are negative to date.  Continue vancomycin and Ancef. 2. Hypokalemia-potassium is 3.3, will replace potassium with K-Dur 40 meq p.o. x1 as Michaela Bryant also got 1 dose of Lasix 20 mg IV.  Check BMP in a.m. 3. Chronic leg swelling - Michaela Bryant says that she has history of chronic leg swelling-, in the past she had a Doppler of the lower extremities which was negative.  There is a questionable history of heart failure.  Will obtain echocardiogram.    DVT prophylaxis: *Lovenox  Code Status: Full code  Family Communication: Discussed with Michaela Bryant's husband at bedside  Disposition Plan: Likely home in the next 2-3 days.   Consultants:  None  Procedures:  None  Continuous infusions .  ceFAZolin (ANCEF) IV Stopped (11/06/16 1015)  . vancomycin Stopped (11/06/16  0801)      Antibiotics:   Anti-infectives (From admission, onward)   Start     Dose/Rate Route Frequency Ordered Stop   11/06/16 0800  vancomycin (VANCOCIN) IVPB 1000 mg/200 mL premix  Status:  Discontinued     1,000 mg 200 mL/hr over 60 Minutes Intravenous Every 24 hours 11/05/16 1300 11/05/16 1501   11/06/16 0600  vancomycin (VANCOCIN) 1,250 mg in sodium chloride 0.9 % 250 mL IVPB     1,250 mg 166.7 mL/hr over 90 Minutes Intravenous Every 24 hours 11/05/16 1501     11/06/16 0200  ceFAZolin (ANCEF) IVPB 2g/100 mL premix     2 g 200 mL/hr over 30 Minutes Intravenous Every 8 hours 11/05/16 1757     11/05/16 1900  ceFAZolin (ANCEF) IVPB 1 g/50 mL premix     1 g 100 mL/hr over 30 Minutes Intravenous  Once 11/05/16 1757 11/05/16 1911   11/05/16 1600  ceFAZolin (ANCEF) IVPB 1 g/50 mL premix  Status:  Discontinued     1 g 100 mL/hr over 30 Minutes Intravenous Every 8 hours 11/05/16 1245 11/05/16 1757   11/05/16 0930  ceFAZolin (ANCEF) IVPB 1 g/50 mL premix     1 g 100 mL/hr over 30 Minutes Intravenous  Once 11/05/16 0918 11/05/16 1041   11/05/16 0745  vancomycin (VANCOCIN) IVPB 1000 mg/200 mL premix     1,000 mg 200 mL/hr over 60 Minutes Intravenous  Once 11/05/16 0737 11/05/16 1148   11/05/16 0745  ceFAZolin (ANCEF) IVPB 1 g/50 mL premix     1 g 100 mL/hr over 30 Minutes Intravenous  Once 11/05/16 0737 11/05/16 0916       Objective   Vitals:   11/05/16 1230 11/05/16 2325 11/06/16 0600 11/06/16 1342  BP: 107/62 107/60 (!) 96/59 110/68  Pulse: 80 90 81 93  Resp: 17 16 16 15   Temp: 98.3 F (36.8 C) 98.1 F (36.7 C) 98.1 F (36.7 C) 97.8 F (36.6 C)  TempSrc: Oral Oral Oral Oral  SpO2: 100% 97% 98% 100%  Weight: 52.2 kg (115 lb)     Height: 5' 5"  (1.651 m)       Intake/Output Summary (Last 24 hours) at 11/06/2016 1455 Last data filed at 11/06/2016 2683 Gross per 24 hour  Intake 1868.75 ml  Output 200 ml  Net 1668.75 ml   Filed Weights   11/05/16 1230  Weight: 52.2  kg (115 lb)     Physical Examination:   Physical Exam: Eyes: No icterus, extraocular muscles intact  Mouth: Oral mucosa is moist, no lesions on palate,  Neck: Supple, no deformities, masses, or tenderness Lungs: Normal respiratory effort, bilateral clear to auscultation, no crackles or wheezes.  Heart: Regular rate and rhythm, S1 and S2 normal, no murmurs, rubs auscultated Abdomen: BS normoactive,soft,nondistended,non-tender to palpation,no organomegaly Extremities: Right ankle is edematous, tender to palpation Neuro : Alert and oriented to time, place and person, No focal deficits  Skin: Mild erythema noted in the right ankle, faint red streak extending up to the calf and right thigh     Data Reviewed: I have personally reviewed following labs and imaging studies  CBG: No results for input(s): GLUCAP in the last 168 hours.  CBC: Recent Labs  Lab 11/05/16 0821 11/05/16 1234 11/06/16 0435  WBC 20.0* 17.8* 10.1  NEUTROABS 19.1*  --   --   HGB 13.9 13.5 11.0*  HCT 41.0 39.6 32.7*  MCV 81.2 80.8 81.3  PLT 259 260 419    Basic Metabolic Panel: Recent Labs  Lab 11/05/16 0821 11/05/16 0935 11/05/16 1234 11/06/16 0435  NA 138  --   --  138  K 3.0*  --   --  3.3*  CL 98*  --   --  107  CO2 28  --   --  25  GLUCOSE 103*  --   --  94  BUN 22*  --   --  23*  CREATININE 0.78  --  0.69 0.67  CALCIUM 8.9  --   --  8.0*  MG  --  1.9  --   --     No results found for this or any previous visit (from the past 240 hour(s)).   Liver Function Tests: Recent Labs  Lab 11/05/16 0821 11/06/16 0435  AST 22 22  ALT 11* 11*  ALKPHOS 57 50  BILITOT 0.7 0.7  PROT 6.9 5.4*  ALBUMIN 3.9 2.8*   No results for input(s): LIPASE, AMYLASE in the last 168 hours. No results for input(s): AMMONIA in the last 168 hours.  Cardiac Enzymes: No results for input(s): CKTOTAL, CKMB, CKMBINDEX, TROPONINI in the last 168 hours. BNP (last 3 results) No results for input(s): BNP in the  last 8760 hours.  ProBNP (last 3 results) No results for input(s): PROBNP in the last 8760 hours.    Studies: Mr Foot Right W Wo Contrast  Result Date: 11/05/2016 CLINICAL DATA:  Prior small toe fracture, with new onset of swelling, pain, and erythema  extending from the toes up into the ankle and distal calf. EXAM: MRI OF THE RIGHT ANKLE WITHOUT AND WITH CONTRAST; MRI OF THE RIGHT FOREFOOT WITHOUT AND WITH CONTRAST TECHNIQUE: Multiplanar, multisequence MR imaging of the ankle was performed before and after the administration of intravenous contrast. CONTRAST:  71m MULTIHANCE GADOBENATE DIMEGLUMINE 529 MG/ML IV SOLN COMPARISON:  None. FINDINGS: MRI FOREFOOT: This a known fracture the proximal phalanx of the small toe with associated surrounding edema for example on image 10/8. There may be some low-level edema in the head of the fifth metatarsal on image 3/8 is well, but given the recent trauma in this vicinity I am skeptical of osteomyelitis. Conventional radiography has reportedly been performed at the outside orthopedist office, and a review to confirm that there is not a subtle fracture of the distal fifth metatarsal would be suggested, but in general I am skeptical of a fifth metatarsal fracture. The remaining toes appear unremarkable. There is no significant joint effusion. Lisfranc ligament intact. The metatarsals and first digit sesamoids appear otherwise normal. No significant abnormal osseous edema in the midfoot. There is subcutaneous edema tracking along the foot dorsally and also with some extension into the second through fifth toes. Subtle edema also tracks between the adductor hallucis oblique head in the metatarsals as shown on image 26/5, with only minimal associated enhancement in this vicinity on image 26/16 may well be reactive. There is no drainable abscess or gas tracking in the soft tissues. MRI HINDFOOT/ANKLE: TENDONS Peroneal: Unremarkable Posteromedial: Mild distal tibialis  posterior tenosynovitis. Anterior: Unremarkable Achilles: Unremarkable Plantar Fascia: Unremarkable LIGAMENTS Lateral: Unremarkable Medial: Unremarkable CARTILAGE Ankle Joint: Unremarkable Subtalar Joints/Sinus Tarsi: Unremarkable Bones: Unremarkable Other: Subcutaneous edema and enhancement tracking along the dorsum of the foot and around the lateral ankle and posterior ankle, and to a lesser extent the medial ankle, and into the calf. IMPRESSION: IMPRESSION 1. The abnormal subcutaneous edema and enhancement tracking in the lateral four toes, in the dorsum of the forefoot, and tracking around the ankle and into the calf, suspicious for cellulitis. Reflex sympathetic dystrophy would be a less likely differential diagnostic consideration. 2. There is some trace enhancement and edema along the plantar musculature of the foot, specifically between the adductor hallucis oblique head and the metatarsals/interosseous muscles. This is most likely reactive and less likely to represent a low-grade fasciitis. No gas is present in the soft tissues. 3. No compelling findings of osteomyelitis. No drainable abscess. Abnormal edema in the proximal phalanx small toe is attributable to the known fracture. There is some subtle edema in the head of the fifth metatarsal which also could be posttraumatic. Node joint effusion to suggest septic joint. 4. Mild distal tibialis posterior tenosynovitis. Electronically Signed   By: WVan ClinesM.D.   On: 11/05/2016 17:10   Mr Ankle Right W Wo Contrast  Result Date: 11/05/2016 CLINICAL DATA:  Prior small toe fracture, with new onset of swelling, pain, and erythema extending from the toes up into the ankle and distal calf. EXAM: MRI OF THE RIGHT ANKLE WITHOUT AND WITH CONTRAST; MRI OF THE RIGHT FOREFOOT WITHOUT AND WITH CONTRAST TECHNIQUE: Multiplanar, multisequence MR imaging of the ankle was performed before and after the administration of intravenous contrast. CONTRAST:  157m MULTIHANCE GADOBENATE DIMEGLUMINE 529 MG/ML IV SOLN COMPARISON:  None. FINDINGS: MRI FOREFOOT: This a known fracture the proximal phalanx of the small toe with associated surrounding edema for example on image 10/8. There may be some low-level edema in the head of the  fifth metatarsal on image 3/8 is well, but given the recent trauma in this vicinity I am skeptical of osteomyelitis. Conventional radiography has reportedly been performed at the outside orthopedist office, and a review to confirm that there is not a subtle fracture of the distal fifth metatarsal would be suggested, but in general I am skeptical of a fifth metatarsal fracture. The remaining toes appear unremarkable. There is no significant joint effusion. Lisfranc ligament intact. The metatarsals and first digit sesamoids appear otherwise normal. No significant abnormal osseous edema in the midfoot. There is subcutaneous edema tracking along the foot dorsally and also with some extension into the second through fifth toes. Subtle edema also tracks between the adductor hallucis oblique head in the metatarsals as shown on image 26/5, with only minimal associated enhancement in this vicinity on image 26/16 may well be reactive. There is no drainable abscess or gas tracking in the soft tissues. MRI HINDFOOT/ANKLE: TENDONS Peroneal: Unremarkable Posteromedial: Mild distal tibialis posterior tenosynovitis. Anterior: Unremarkable Achilles: Unremarkable Plantar Fascia: Unremarkable LIGAMENTS Lateral: Unremarkable Medial: Unremarkable CARTILAGE Ankle Joint: Unremarkable Subtalar Joints/Sinus Tarsi: Unremarkable Bones: Unremarkable Other: Subcutaneous edema and enhancement tracking along the dorsum of the foot and around the lateral ankle and posterior ankle, and to a lesser extent the medial ankle, and into the calf. IMPRESSION: IMPRESSION 1. The abnormal subcutaneous edema and enhancement tracking in the lateral four toes, in the dorsum of the forefoot, and  tracking around the ankle and into the calf, suspicious for cellulitis. Reflex sympathetic dystrophy would be a less likely differential diagnostic consideration. 2. There is some trace enhancement and edema along the plantar musculature of the foot, specifically between the adductor hallucis oblique head and the metatarsals/interosseous muscles. This is most likely reactive and less likely to represent a low-grade fasciitis. No gas is present in the soft tissues. 3. No compelling findings of osteomyelitis. No drainable abscess. Abnormal edema in the proximal phalanx small toe is attributable to the known fracture. There is some subtle edema in the head of the fifth metatarsal which also could be posttraumatic. Node joint effusion to suggest septic joint. 4. Mild distal tibialis posterior tenosynovitis. Electronically Signed   By: Van Clines M.D.   On: 11/05/2016 17:10    Scheduled Meds: . acidophilus  1 capsule Oral Daily  . amphetamine-dextroamphetamine  10 mg Oral BID  . enoxaparin (LOVENOX) injection  40 mg Subcutaneous Q24H  . oxymetazoline  1 spray Each Nare BID      Time spent: 20 min  [facility] Pager [phone]. If 7PM-7AM, please contact night-coverage at [email or web address], Office  [phone]  password [place]  11/06/2016, 2:55 PM  LOS: 1 day

## 2016-11-07 ENCOUNTER — Other Ambulatory Visit: Payer: Self-pay

## 2016-11-07 DIAGNOSIS — Z791 Long term (current) use of non-steroidal anti-inflammatories (NSAID): Secondary | ICD-10-CM | POA: Diagnosis not present

## 2016-11-07 DIAGNOSIS — L03116 Cellulitis of left lower limb: Secondary | ICD-10-CM | POA: Diagnosis not present

## 2016-11-07 DIAGNOSIS — E876 Hypokalemia: Secondary | ICD-10-CM | POA: Diagnosis not present

## 2016-11-07 DIAGNOSIS — Z79899 Other long term (current) drug therapy: Secondary | ICD-10-CM | POA: Diagnosis not present

## 2016-11-07 DIAGNOSIS — M7989 Other specified soft tissue disorders: Secondary | ICD-10-CM | POA: Diagnosis not present

## 2016-11-07 DIAGNOSIS — L03115 Cellulitis of right lower limb: Secondary | ICD-10-CM | POA: Diagnosis not present

## 2016-11-07 LAB — CBC
HCT: 33.1 % — ABNORMAL LOW (ref 36.0–46.0)
HEMOGLOBIN: 11.3 g/dL — AB (ref 12.0–15.0)
MCH: 27.3 pg (ref 26.0–34.0)
MCHC: 34.1 g/dL (ref 30.0–36.0)
MCV: 80 fL (ref 78.0–100.0)
PLATELETS: 234 10*3/uL (ref 150–400)
RBC: 4.14 MIL/uL (ref 3.87–5.11)
RDW: 14 % (ref 11.5–15.5)
WBC: 7.8 10*3/uL (ref 4.0–10.5)

## 2016-11-07 LAB — BASIC METABOLIC PANEL
Anion gap: 8 (ref 5–15)
Anion gap: 8 (ref 5–15)
BUN: 11 mg/dL (ref 6–20)
BUN: 11 mg/dL (ref 6–20)
CALCIUM: 8.7 mg/dL — AB (ref 8.9–10.3)
CO2: 27 mmol/L (ref 22–32)
CO2: 29 mmol/L (ref 22–32)
CREATININE: 0.5 mg/dL (ref 0.44–1.00)
CREATININE: 0.61 mg/dL (ref 0.44–1.00)
Calcium: 8.9 mg/dL (ref 8.9–10.3)
Chloride: 106 mmol/L (ref 101–111)
Chloride: 101 mmol/L (ref 101–111)
GFR calc Af Amer: >60 mL/min (ref >60)
GFR calc Af Amer: >60 mL/min (ref >60)
Glucose, Bld: 76 mg/dL (ref 65–99)
Glucose, Bld: 92 mg/dL (ref 65–99)
Potassium: 3.1 mmol/L — ABNORMAL LOW (ref 3.5–5.1)
Potassium: 4.1 mmol/L (ref 3.5–5.1)
SODIUM: 141 mmol/L (ref 135–145)
SODIUM: 138 mmol/L (ref 135–145)

## 2016-11-07 MED ORDER — LINEZOLID 600 MG PO TABS
600.0000 mg | ORAL_TABLET | Freq: Two times a day (BID) | ORAL | Status: DC
  Administered 2016-11-07: 600 mg via ORAL
  Filled 2016-11-07: qty 1

## 2016-11-07 MED ORDER — LINEZOLID 600 MG PO TABS: 600.0000 mg | ORAL_TABLET | Freq: Two times a day (BID) | ORAL | 0 refills | Status: DC

## 2016-11-07 MED ORDER — POTASSIUM CHLORIDE CRYS ER 20 MEQ PO TBCR
40.0000 meq | EXTENDED_RELEASE_TABLET | ORAL | Status: AC
  Administered 2016-11-07 (×2): 40 meq via ORAL
  Filled 2016-11-07 (×2): qty 2

## 2016-11-07 MED FILL — LINEZOLID 600 MG TABLET: 600 | 14 days supply | Qty: 28 | Fill #0

## 2016-11-07 NOTE — Progress Notes (Signed)
Per benefits check: Generic Linezolid needs no prior authorization and is a 5$ copay at [employer] Pt and MD informed. Marney Doctor RN,BSN,NCM [phone]

## 2016-11-07 NOTE — Discharge Summary (Signed)
Physician Discharge Summary  Faren Florence dam [id number] DOB: 1975-10-13 DOA: 11/05/2016  PCP: Hunt Oris, DO  Admit date: 11/05/2016 Discharge date: 11/07/2016  Time spent: 35* minutes  Recommendations for Outpatient Follow-up:  1. Follow up PCP in 2 weeks  Discharge Diagnoses:  Active Problems:   Sepsis [facility])   Discharge Condition: Stable  Diet recommendation: Regular diet  Filed Weights   11/05/16 1230 11/07/16 0514  Weight: 52.2 kg (115 lb) 55.6 kg (122 lb 9.6 oz)    History of present illness:  41 y.o.femalewith history of celiac disease presented to emergency room due to right foot pain and feeling nauseous/ being weak. She broke her right fifth toe 2 weeks ago. Has been able to ambulate without difficulty and has returned to her exercise routine. However, 3 days ago, she attempted to get running, and developed nausea after running with progressive weakness and occasional chills. She noticed small amount of redness of her right foot last night. This morning the redness extended to the right lateral foot to her right ankle, right leg and up to right inner thigh. She reports some numbness and tingling to right toes.    Hospital Course:   1. Right ankle cellulitis-improving, patient has been afebrile.  WBCs down to 7.8.  Pain is well controlled.  Blood cultures x2 are negative to date.  Patient was started on vancomycin and Ancef. Will discharge on Linezolid 600 mg po bid x 2 weeks. 2. Hypokalemia-potassium was 3.1 this morning.  Replaced potassium and repeat potassium is 4.1 3. Chronic leg swelling - patient says that she has history of chronic leg swelling-, in the past she had a Doppler of the lower extremities which was negative.  There is a questionable history of heart failure.  Echocardiogram was obtained which did not show significant abnormality.  Ejection fraction was 55-60%.     Procedures:  None  Consultations:  ID phone consult  only  Orthopedics  Discharge Exam: Vitals:   11/06/16 2127 11/07/16 0514  BP: 110/72 97/67  Pulse: 94 86  Resp: 16 16  Temp: 98.4 F (36.9 C) 98.2 F (36.8 C)  SpO2: 99% 99%    General: Appears in no acute distress Cardiovascular: S1-S2, regular Respiratory: Clear to auscultation bilaterally Extremities-right lower extremity very minimal erythema, mild edema.  Nontender to palpation.  Discharge Instructions   Discharge Instructions    Diet - low sodium heart healthy   Complete by:  As directed    Increase activity slowly   Complete by:  As directed      Current Discharge Medication List    START taking these medications   Details  linezolid (ZYVOX) 600 MG tablet Take 1 tablet (600 mg total) 2 (two) times daily by mouth. Qty: 28 tablet, Refills: 0      CONTINUE these medications which have NOT CHANGED   Details  acetaminophen (TYLENOL) 500 MG tablet Take 1,000 mg by mouth every 6 (six) hours as needed for mild pain or headache.    acidophilus (RISAQUAD) CAPS capsule Take 1 capsule by mouth daily.    amphetamine-dextroamphetamine (ADDERALL) 10 MG tablet Take 10 mg by mouth 2 (two) times daily.    hydrochlorothiazide (MICROZIDE) 12.5 MG capsule TAKE 1 CAPSULE BY MOUTH DAILY IN THE MORNING AS NEEDED FOR SWELLING/FLUID Refills: 0    ibuprofen (ADVIL,MOTRIN) 200 MG tablet Take 800 mg by mouth every 8 (eight) hours as needed for headache or mild pain.       STOP taking these  medications     naproxen sodium (ALEVE) 220 MG tablet        Allergies  Allergen Reactions  . Gluten Meal Nausea Only      The results of significant diagnostics from this hospitalization (including imaging, microbiology, ancillary and laboratory) are listed below for reference.    Significant Diagnostic Studies: Mr Foot Right W Wo Contrast  Result Date: 11/05/2016 CLINICAL DATA:  Prior small toe fracture, with new onset of swelling, pain, and erythema extending from the toes up  into the ankle and distal calf. EXAM: MRI OF THE RIGHT ANKLE WITHOUT AND WITH CONTRAST; MRI OF THE RIGHT FOREFOOT WITHOUT AND WITH CONTRAST TECHNIQUE: Multiplanar, multisequence MR imaging of the ankle was performed before and after the administration of intravenous contrast. CONTRAST:  62m MULTIHANCE GADOBENATE DIMEGLUMINE 529 MG/ML IV SOLN COMPARISON:  None. FINDINGS: MRI FOREFOOT: This a known fracture the proximal phalanx of the small toe with associated surrounding edema for example on image 10/8. There may be some low-level edema in the head of the fifth metatarsal on image 3/8 is well, but given the recent trauma in this vicinity I am skeptical of osteomyelitis. Conventional radiography has reportedly been performed at the outside orthopedist office, and a review to confirm that there is not a subtle fracture of the distal fifth metatarsal would be suggested, but in general I am skeptical of a fifth metatarsal fracture. The remaining toes appear unremarkable. There is no significant joint effusion. Lisfranc ligament intact. The metatarsals and first digit sesamoids appear otherwise normal. No significant abnormal osseous edema in the midfoot. There is subcutaneous edema tracking along the foot dorsally and also with some extension into the second through fifth toes. Subtle edema also tracks between the adductor hallucis oblique head in the metatarsals as shown on image 26/5, with only minimal associated enhancement in this vicinity on image 26/16 may well be reactive. There is no drainable abscess or gas tracking in the soft tissues. MRI HINDFOOT/ANKLE: TENDONS Peroneal: Unremarkable Posteromedial: Mild distal tibialis posterior tenosynovitis. Anterior: Unremarkable Achilles: Unremarkable Plantar Fascia: Unremarkable LIGAMENTS Lateral: Unremarkable Medial: Unremarkable CARTILAGE Ankle Joint: Unremarkable Subtalar Joints/Sinus Tarsi: Unremarkable Bones: Unremarkable Other: Subcutaneous edema and enhancement  tracking along the dorsum of the foot and around the lateral ankle and posterior ankle, and to a lesser extent the medial ankle, and into the calf. IMPRESSION: IMPRESSION 1. The abnormal subcutaneous edema and enhancement tracking in the lateral four toes, in the dorsum of the forefoot, and tracking around the ankle and into the calf, suspicious for cellulitis. Reflex sympathetic dystrophy would be a less likely differential diagnostic consideration. 2. There is some trace enhancement and edema along the plantar musculature of the foot, specifically between the adductor hallucis oblique head and the metatarsals/interosseous muscles. This is most likely reactive and less likely to represent a low-grade fasciitis. No gas is present in the soft tissues. 3. No compelling findings of osteomyelitis. No drainable abscess. Abnormal edema in the proximal phalanx small toe is attributable to the known fracture. There is some subtle edema in the head of the fifth metatarsal which also could be posttraumatic. Node joint effusion to suggest septic joint. 4. Mild distal tibialis posterior tenosynovitis. Electronically Signed   By: WVan ClinesM.D.   On: 11/05/2016 17:10   Mr Ankle Right W Wo Contrast  Result Date: 11/05/2016 CLINICAL DATA:  Prior small toe fracture, with new onset of swelling, pain, and erythema extending from the toes up into the ankle and distal calf. EXAM:  MRI OF THE RIGHT ANKLE WITHOUT AND WITH CONTRAST; MRI OF THE RIGHT FOREFOOT WITHOUT AND WITH CONTRAST TECHNIQUE: Multiplanar, multisequence MR imaging of the ankle was performed before and after the administration of intravenous contrast. CONTRAST:  86m MULTIHANCE GADOBENATE DIMEGLUMINE 529 MG/ML IV SOLN COMPARISON:  None. FINDINGS: MRI FOREFOOT: This a known fracture the proximal phalanx of the small toe with associated surrounding edema for example on image 10/8. There may be some low-level edema in the head of the fifth metatarsal on image 3/8  is well, but given the recent trauma in this vicinity I am skeptical of osteomyelitis. Conventional radiography has reportedly been performed at the outside orthopedist office, and a review to confirm that there is not a subtle fracture of the distal fifth metatarsal would be suggested, but in general I am skeptical of a fifth metatarsal fracture. The remaining toes appear unremarkable. There is no significant joint effusion. Lisfranc ligament intact. The metatarsals and first digit sesamoids appear otherwise normal. No significant abnormal osseous edema in the midfoot. There is subcutaneous edema tracking along the foot dorsally and also with some extension into the second through fifth toes. Subtle edema also tracks between the adductor hallucis oblique head in the metatarsals as shown on image 26/5, with only minimal associated enhancement in this vicinity on image 26/16 may well be reactive. There is no drainable abscess or gas tracking in the soft tissues. MRI HINDFOOT/ANKLE: TENDONS Peroneal: Unremarkable Posteromedial: Mild distal tibialis posterior tenosynovitis. Anterior: Unremarkable Achilles: Unremarkable Plantar Fascia: Unremarkable LIGAMENTS Lateral: Unremarkable Medial: Unremarkable CARTILAGE Ankle Joint: Unremarkable Subtalar Joints/Sinus Tarsi: Unremarkable Bones: Unremarkable Other: Subcutaneous edema and enhancement tracking along the dorsum of the foot and around the lateral ankle and posterior ankle, and to a lesser extent the medial ankle, and into the calf. IMPRESSION: IMPRESSION 1. The abnormal subcutaneous edema and enhancement tracking in the lateral four toes, in the dorsum of the forefoot, and tracking around the ankle and into the calf, suspicious for cellulitis. Reflex sympathetic dystrophy would be a less likely differential diagnostic consideration. 2. There is some trace enhancement and edema along the plantar musculature of the foot, specifically between the adductor hallucis oblique  head and the metatarsals/interosseous muscles. This is most likely reactive and less likely to represent a low-grade fasciitis. No gas is present in the soft tissues. 3. No compelling findings of osteomyelitis. No drainable abscess. Abnormal edema in the proximal phalanx small toe is attributable to the known fracture. There is some subtle edema in the head of the fifth metatarsal which also could be posttraumatic. Node joint effusion to suggest septic joint. 4. Mild distal tibialis posterior tenosynovitis. Electronically Signed   By: WVan ClinesM.D.   On: 11/05/2016 17:10    Microbiology: Recent Results (from the past 240 hour(s))  Blood Culture (routine x 2)     Status: None (Preliminary result)   Collection Time: 11/05/16  8:21 AM  Result Value Ref Range Status   Specimen Description BLOOD RIGHT ANTECUBITAL  Final   Special Requests   Final    BOTTLES DRAWN AEROBIC AND ANAEROBIC Blood Culture adequate volume   Culture   Final    NO GROWTH 2 DAYS [facility]formed [facility] [address], [place] [address]   Report Status PENDING  Incomplete  Blood Culture (routine x 2)     Status: None (Preliminary result)   Collection Time: 11/05/16  8:31 AM  Result Value Ref Range Status   Specimen Description BLOOD  RIGHT ANTECUBITAL  Final   Special Requests   Final    BOTTLES DRAWN AEROBIC AND ANAEROBIC Blood Culture adequate volume   Culture   Final    NO GROWTH 2 DAYS Performed at [facility], [address], [place], [place] [address]    Report Status PENDING  Incomplete     Labs: Basic Metabolic Panel: Recent Labs  Lab 11/05/16 0821 11/05/16 0935 11/05/16 1234 11/06/16 0435 11/07/16 0446 11/07/16 1318  NA 138  --   --  138 141 138  K 3.0*  --   --  3.3* 3.1* 4.1  CL 98*  --   --  107 106 101  CO2 28  --   --  25 27 29   GLUCOSE 103*  --   --  94 76 92  BUN 22*  --   --  23* 11 11  CREATININE 0.78  --  0.69 0.67 0.50 0.61  CALCIUM 8.9  --   --   8.0* 8.7* 8.9  MG  --  1.9  --   --   --   --    Liver Function Tests: Recent Labs  Lab 11/05/16 0821 11/06/16 0435  AST 22 22  ALT 11* 11*  ALKPHOS 57 50  BILITOT 0.7 0.7  PROT 6.9 5.4*  ALBUMIN 3.9 2.8*   No results for input(s): LIPASE, AMYLASE in the last 168 hours. No results for input(s): AMMONIA in the last 168 hours. CBC: Recent Labs  Lab 11/05/16 0821 11/05/16 1234 11/06/16 0435 11/07/16 0446  WBC 20.0* 17.8* 10.1 7.8  NEUTROABS 19.1*  --   --   --   HGB 13.9 13.5 11.0* 11.3*  HCT 41.0 39.6 32.7* 33.1*  MCV 81.2 80.8 81.3 80.0  PLT 259 260 213 234       Signed:  Dollene Mallery S MD.  [facility] 11/07/2016, 2:58 PM

## 2016-11-10 ENCOUNTER — Other Ambulatory Visit: Payer: Self-pay | Admitting: *Deleted

## 2016-11-10 LAB — CULTURE, BLOOD (ROUTINE X 2)
CULTURE: NO GROWTH
Culture: NO GROWTH
SPECIAL REQUESTS: ADEQUATE
Special Requests: ADEQUATE

## 2016-11-10 NOTE — Patient Outreach (Signed)
[facility]) Care Management  11/10/2016  Michaela Bryant dam 29-Jun-1975 [id number]   Subjective: Telephone call to patient's home  / mobile number, no answer, left HIPAA compliant voicemail message, and requested call back.    Objective: Per KPN (Knowledge Performance Now, point of care tool) and chart review, patient hospitalized 11/05/16 -11/07/16 for Sepsis and right ankle cellulitis.   Patient also has a history of celiac disease.    Assessment: Received UMR Transition of care referral on 11/09/16.  Transition of care follow up pending patient contact.     Plan: RNCM will call patient for 2nd telephone outreach attempt, transition of care follow up, within 10 business days if no return call.     Alexis Mizuno H. Annia Friendly, BSN, [place] Management [facility] Telephonic CM Phone: [phone] Fax: [phone]

## 2016-11-11 ENCOUNTER — Other Ambulatory Visit: Payer: Self-pay | Admitting: *Deleted

## 2016-11-11 ENCOUNTER — Ambulatory Visit: Payer: 59 | Admitting: *Deleted

## 2016-11-11 NOTE — Patient Outreach (Signed)
[place] [facility]) Care Management  11/11/2016  Michaela Bryant dam 12-10-1975 [id number]   Subjective: Telephone call to patient's home  / mobile number, no answer, left HIPAA compliant voicemail message, and requested call back.    Objective: Per KPN (Knowledge Performance Now, point of care tool) and chart review, patient hospitalized 11/05/16 -11/07/16 for Sepsis and right ankle cellulitis.   Patient also has a history of celiac disease.    Assessment: Received UMR Transition of care referral on 11/09/16.  Transition of care follow up pending patient contact.     Plan: RNCM will call patient for 3rd telephone outreach attempt, transition of care follow up, within 10 business days if no return call.     Amilia Vandenbrink H. Annia Friendly, BSN, [place] Management [facility] Telephonic CM Phone: [phone] Fax: [phone]

## 2016-11-14 ENCOUNTER — Ambulatory Visit: Payer: 59 | Admitting: *Deleted

## 2016-11-14 ENCOUNTER — Encounter: Payer: Self-pay | Admitting: *Deleted

## 2016-11-14 ENCOUNTER — Other Ambulatory Visit: Payer: Self-pay | Admitting: *Deleted

## 2016-11-14 NOTE — Patient Outreach (Signed)
[facility]) Care Management  11/14/2016  Anah Billard dam 1975/11/30 [id number]   Subjective:Telephone call to patient's home / mobile number, no answer, left HIPAA compliant voicemail message, and requested call back.    Objective:Per KPN (Knowledge Performance Now, point of care tool) and chart review,patient hospitalized 11/05/16 -11/07/16 forSepsisand right ankle cellulitis. Patient also has a history of celiac disease.    Assessment: Received UMR Transition of care referral on 11/09/16.Transition of care follow up pending patient contact.    Plan:RNCM will send unsuccessful outreach  letter, [facility] pamphlet, and proceed with case closure, within 10 business days if no return call.    Minnie Shi H. Annia Friendly, BSN, [place] Management [facility] Telephonic CM Phone: [phone] Fax: [phone]

## 2016-11-16 DIAGNOSIS — R609 Edema, unspecified: Secondary | ICD-10-CM | POA: Diagnosis not present

## 2016-11-16 DIAGNOSIS — F988 Other specified behavioral and emotional disorders with onset usually occurring in childhood and adolescence: Secondary | ICD-10-CM | POA: Diagnosis not present

## 2016-11-16 DIAGNOSIS — Z23 Encounter for immunization: Secondary | ICD-10-CM | POA: Diagnosis not present

## 2016-11-16 DIAGNOSIS — L03115 Cellulitis of right lower limb: Secondary | ICD-10-CM | POA: Diagnosis not present

## 2016-11-17 ENCOUNTER — Other Ambulatory Visit: Payer: Self-pay | Admitting: *Deleted

## 2016-11-17 NOTE — Patient Outreach (Signed)
[facility]) Care Management  11/17/2016  Cordell Coke dam Mar 04, 1975 [id number]   Subjective: Received voicemail from Ms. Rashel Okeefe dam, states she is returning call, has been out of town, and requested call back.  Telephone call to patient's home  / mobile number, no answer, left HIPAA compliant voicemail message, and requested call back.    Objective: Per KPN (Knowledge Performance Now, point of care tool) and chart review,patient hospitalized 11/05/16 -11/07/16 forSepsisand right ankle cellulitis. Patient also has a history of celiac disease.    Assessment: Received UMR Transition of care referral on 11/09/16.Transition of care follow up pending patient contact.    Plan:RNCM has sent unsuccessful outreach  letter, [facility] pamphlet, and proceed with case closure, within 10 business days if no return call.    Thaniel Coluccio H. Annia Friendly, BSN, [place] Management [facility] Telephonic CM Phone: [phone] Fax: [phone]

## 2016-11-18 MED FILL — DEXTROAMP-AMP 10 MG TAB: 10 | 30 days supply | Qty: 90 | Fill #0

## 2016-11-28 ENCOUNTER — Other Ambulatory Visit: Payer: Self-pay | Admitting: *Deleted

## 2016-11-28 NOTE — Patient Outreach (Signed)
Michaela Bryant Michaela Bryant) [facility] Bryant  11/28/2016  Michaela Bryant dam 1975/11/17 [id number]   No response from patient outreach attempts will proceed with case closure.     Objective: Per Michaela Bryant (Michaela Bryant, point of Bryant tool) and chart review,patient hospitalized 11/05/16 -11/07/16 forSepsisand right ankle cellulitis. Patient also has a history of celiac disease.    Assessment: Received UMR Transition of Bryant referral on 11/09/16.Transition of Bryant follow up not completed due to unable to contact patient and will proceed with case closure.     Plan:RNCM will send case closure due to unable to reach request to Michaela Bryant at Michaela Bryant.     Michaela Bryant. Annia Friendly, BSN, [place] [facility] [place] [facility] Telephonic CM Phone: [phone] Fax: [phone]

## 2016-12-13 MED FILL — HYDROCHLOROTHIAZIDE 25 MG T: 25 | 90 days supply | Qty: 90 | Fill #2

## 2016-12-16 MED FILL — DEXTROAMP-AMP 10 MG TAB: 10 | 30 days supply | Qty: 90 | Fill #0

## 2017-01-17 MED FILL — AMPHETAMINE-DEXTROAMPHETAMI: 10 | 30 days supply | Qty: 90 | Fill #0

## 2017-02-15 MED FILL — AMPHETAMINE-DEXTRO 20MG: 20 | 30 days supply | Qty: 45 | Fill #0

## 2017-03-01 MED FILL — AMLODIPINE BESYLATE 5 MG TA: 5 | 30 days supply | Qty: 30 | Fill #0

## 2017-03-02 DIAGNOSIS — Z319 Encounter for procreative management, unspecified: Secondary | ICD-10-CM | POA: Diagnosis not present

## 2017-03-17 MED FILL — DEXTROAMP-AMPHETAMIN 20 MG: 20 | 30 days supply | Qty: 45 | Fill #0

## 2017-04-14 MED FILL — DEXTROAMP-AMPHETAMIN 20 MG: 20 | 30 days supply | Qty: 45 | Fill #0

## 2017-05-08 DIAGNOSIS — F988 Other specified behavioral and emotional disorders with onset usually occurring in childhood and adolescence: Secondary | ICD-10-CM | POA: Diagnosis not present

## 2017-05-08 DIAGNOSIS — F320 Major depressive disorder, single episode, mild: Secondary | ICD-10-CM | POA: Diagnosis not present

## 2017-05-08 MED FILL — buPROPion HCL ER (XL) 150 M: 150 | 30 days supply | Qty: 30 | Fill #0

## 2017-05-12 MED FILL — DEXTROAMP-AMPHETAMIN 20 MG: 20 | 30 days supply | Qty: 60 | Fill #0

## 2017-05-30 MED FILL — HYDROCHLOROTHIAZIDE 25 MG T: 25 | 90 days supply | Qty: 45 | Fill #0

## 2017-06-05 ENCOUNTER — Encounter: Payer: Self-pay | Admitting: Adult Health

## 2017-06-05 ENCOUNTER — Ambulatory Visit (INDEPENDENT_AMBULATORY_CARE_PROVIDER_SITE_OTHER): Payer: 59 | Admitting: Adult Health

## 2017-06-05 VITALS — BP 91/50 | HR 60 | Ht 64.25 in | Wt 116.4 lb

## 2017-06-05 DIAGNOSIS — Z0000 Encounter for general adult medical examination without abnormal findings: Secondary | ICD-10-CM

## 2017-06-05 DIAGNOSIS — G588 Other specified mononeuropathies: Secondary | ICD-10-CM

## 2017-06-05 DIAGNOSIS — F411 Generalized anxiety disorder: Secondary | ICD-10-CM

## 2017-06-05 DIAGNOSIS — F909 Attention-deficit hyperactivity disorder, unspecified type: Secondary | ICD-10-CM

## 2017-06-05 DIAGNOSIS — F988 Other specified behavioral and emotional disorders with onset usually occurring in childhood and adolescence: Secondary | ICD-10-CM | POA: Insufficient documentation

## 2017-06-05 MED ORDER — AMPHETAMINE-DEXTROAMPHETAMINE 30 MG PO TABS: 30.0000 mg | ORAL_TABLET | Freq: Two times a day (BID) | ORAL | 0 refills | Status: DC

## 2017-06-05 MED ORDER — BUPROPION HCL ER (XL) 150 MG PO TB24: 150.0000 mg | ORAL_TABLET | Freq: Every day | ORAL | 1 refills | Status: DC

## 2017-06-05 MED FILL — buPROPion HCL ER (XL) 150 M: 150 | 90 days supply | Qty: 90 | Fill #0

## 2017-06-05 MED FILL — AMPHETAMINE SALTS 30 MG TAB: 30 | 30 days supply | Qty: 60 | Fill #0

## 2017-06-05 NOTE — Patient Instructions (Addendum)
Mediterranean Diet A Mediterranean diet refers to food and lifestyle choices that are based on the traditions of countries located on the [employer]. This way of eating has been shown to help prevent certain conditions and improve outcomes for people who have chronic diseases, like kidney disease and heart disease. What are tips for following this plan? Lifestyle  Cook and eat meals together with your family, when possible.  Drink enough fluid to keep your urine clear or pale yellow.  Be physically active every day. This includes: ? Aerobic exercise like running or swimming. ? Leisure activities like gardening, walking, or housework.  Get 7-8 hours of sleep each night.  If recommended by your health care provider, drink red wine in moderation. This means 1 glass a day for nonpregnant women and 2 glasses a day for men. A glass of wine equals 5 oz (150 mL). Reading food labels  Check the serving size of packaged foods. For foods such as rice and pasta, the serving size refers to the amount of cooked product, not dry.  Check the total fat in packaged foods. Avoid foods that have saturated fat or trans fats.  Check the ingredients list for added sugars, such as corn syrup. Shopping  At the grocery store, buy most of your food from the areas near the walls of the store. This includes: ? Fresh fruits and vegetables (produce). ? Grains, beans, nuts, and seeds. Some of these may be available in unpackaged forms or large amounts (in bulk). ? Fresh seafood. ? Poultry and eggs. ? Low-fat dairy products.  Buy whole ingredients instead of prepackaged foods.  Buy fresh fruits and vegetables in-season from local farmers markets.  Buy frozen fruits and vegetables in resealable bags.  If you do not have access to quality fresh seafood, buy precooked frozen shrimp or canned fish, such as tuna, salmon, or sardines.  Buy small amounts of raw or cooked vegetables, salads, or olives from the  deli or salad bar at your store.  Stock your pantry so you always have certain foods on hand, such as olive oil, canned tuna, canned tomatoes, rice, pasta, and beans. Cooking  Cook foods with extra-virgin olive oil instead of using butter or other vegetable oils.  Have meat as a side dish, and have vegetables or grains as your main dish. This means having meat in small portions or adding small amounts of meat to foods like pasta or stew.  Use beans or vegetables instead of meat in common dishes like chili or lasagna.  Experiment with different cooking methods. Try roasting or broiling vegetables instead of steaming or sauteing them.  Add frozen vegetables to soups, stews, pasta, or rice.  Add nuts or seeds for added healthy fat at each meal. You can add these to yogurt, salads, or vegetable dishes.  Marinate fish or vegetables using olive oil, lemon juice, garlic, and fresh herbs. Meal planning  Plan to eat 1 vegetarian meal one day each week. Try to work up to 2 vegetarian meals, if possible.  Eat seafood 2 or more times a week.  Have healthy snacks readily available, such as: ? Vegetable sticks with hummus. ? [place] yogurt. ? Fruit and nut trail mix.  Eat balanced meals throughout the week. This includes: ? Fruit: 2-3 servings a day ? Vegetables: 4-5 servings a day ? Low-fat dairy: 2 servings a day ? Fish, poultry, or lean meat: 1 serving a day ? Beans and legumes: 2 or more servings a week ? Nuts  and seeds: 1-2 servings a day ? Whole grains: 6-8 servings a day ? Extra-virgin olive oil: 3-4 servings a day  Limit red meat and sweets to only a few servings a month What are my food choices?  Mediterranean diet ? Recommended ? Grains: Whole-grain pasta. Brown rice. Bulgar wheat. Polenta. Couscous. Whole-wheat bread. [place]. ? Vegetables: Artichokes. Beets. Broccoli. Cabbage. Carrots. Eggplant. Green beans. Chard. Kale. Spinach. Onions. Leeks. Peas. Squash.  Tomatoes. Peppers. Radishes. ? Fruits: Apples. Apricots. Avocado. Berries. Bananas. Cherries. Dates. Figs. Grapes. Lemons. Melon. Oranges. Peaches. Plums. Pomegranate. ? Meats and other protein foods: Beans. Almonds. Sunflower seeds. Pine nuts. Peanuts. [place]. Salmon. Scallops. Shrimp. [place]. Tilapia. Clams. Oysters. Eggs. ? Dairy: Low-fat milk. Cheese. Greek yogurt. ? Beverages: Water. Red wine. Herbal tea. ? Fats and oils: Extra virgin olive oil. Avocado oil. Grape seed oil. ? Sweets and desserts: [place] yogurt with honey. Baked apples. Poached pears. Trail mix. ? Seasoning and other foods: Basil. Cilantro. Coriander. Cumin. Mint. Parsley. Sage. Rosemary. Tarragon. Garlic. Oregano. Thyme. Pepper. Balsalmic vinegar. Tahini. Hummus. Tomato sauce. Olives. Mushrooms. ? Limit these ? Grains: Prepackaged pasta or rice dishes. Prepackaged cereal with added sugar. ? Vegetables: Deep fried potatoes (french fries). ? Fruits: Fruit canned in syrup. ? Meats and other protein foods: Beef. Pork. Lamb. Poultry with skin. Hot dogs. [place] Salines. ? Dairy: Ice cream. Sour cream. Whole milk. ? Beverages: Juice. Sugar-sweetened soft drinks. Beer. Liquor and spirits. ? Fats and oils: Butter. Canola oil. Vegetable oil. Beef fat (tallow). Lard. ? Sweets and desserts: Cookies. Cakes. Pies. Candy. ? Seasoning and other foods: Mayonnaise. Premade sauces and marinades. ? The items listed may not be a complete list. Talk with your dietitian about what dietary choices are right for you. Summary  The Mediterranean diet includes both food and lifestyle choices.  Eat a variety of fresh fruits and vegetables, beans, nuts, seeds, and whole grains.  Limit the amount of red meat and sweets that you eat.  Talk with your health care provider about whether it is safe for you to drink red wine in moderation. This means 1 glass a day for nonpregnant women and 2 glasses a day for men. A glass of wine equals 5 oz (150 mL). This information  is not intended to replace advice given to you by your health care provider. Make sure you discuss any questions you have with your health care provider. Document Released: 08/13/2015 Document Revised: 09/15/2015 Document Reviewed: 08/13/2015 Elsevier Interactive Patient Education  2018 [place] With Attention Deficit Hyperactivity Disorder If you have been diagnosed with attention deficit hyperactivity disorder (ADHD), you may be relieved that you now know why you have felt or behaved a certain way. Still, you may feel overwhelmed about the treatment ahead. You may also wonder how to get the support you need and how to deal with the condition day-to-day. With treatment and support, you can live with ADHD and manage your symptoms. How to manage lifestyle changes Managing stress Stress is your body's reaction to life changes and events, both good and bad. To cope with the stress of an ADHD diagnosis, it may help to:  Learn more about ADHD.  Exercise regularly. Even a short daily walk can lower stress levels.  Participate in training or education programs (including social skills training classes) that teach you to deal with symptoms.  Medicines Your health care provider may suggest certain medicines if he or she feels that they will help to improve your condition. Stimulant  medicines are usually prescribed to treat ADHD, and therapy may also be prescribed. It is important to:  Avoid using alcohol and other substances that may prevent your medicines from working properly [facility]  Talk with your pharmacist or health care provider about all the medicines that you take, their possible side effects, and what medicines are safe to take together.  Make it your goal to take part in all treatment decisions (shared decision-making). Ask about possible side effects of medicines that your health care provider recommends, and tell him or her how you feel about having those side  effects. It is best if shared decision-making with your health care provider is part of your total treatment plan.  Relationships To strengthen your relationships with family members while treating your condition, consider taking part in family therapy. You might also attend self-help groups alone or with a loved one. Be honest about how your symptoms affect your relationships. Make an effort to communicate respectfully instead of fighting, and find ways to show others that you care. Psychotherapy may be useful in helping you cope with how ADHD affects your relationships. How to recognize changes in your condition The following signs may mean that your treatment is working well and your condition is improving:  Consistently being on time for appointments.  Being more organized at home and work.  Other people noticing improvements in your behavior.  Achieving goals that you set for yourself.  Thinking more clearly.  The following signs may mean that your treatment is not working very well:  Feeling impatience or more confusion.  Missing, forgetting, or being late for appointments.  An increasing sense of disorganization and messiness.  More difficulty in reaching goals that you set for yourself.  Loved ones becoming angry or frustrated with you.  Where to find support Talking to others  Keep emotion out of important discussions and speak in a calm, logical way.  Listen closely and patiently to your loved ones. Try to understand their point of view, and try to avoid getting defensive.  Take responsibility for the consequences of your actions.  Ask that others do not take your behaviors personally.  Aim to solve problems as they come up, and express your feelings instead of bottling them up.  Talk openly about what you need from your loved ones and how they can support you.  Consider going to family therapy sessions or having your family meet with a specialist who deals with  ADHD-related behavior problems. Finances Not all insurance plans cover mental health care, so it is important to check with your insurance carrier. If paying for co-pays or counseling services is a problem, search for a local or county mental health care center. Public mental health care services may be offered there at a low cost or no cost when you are not able to see a private health care provider. If you are taking medicine for ADHD, you may be able to get the generic form, which may be less expensive than brand-name medicine. Some makers of prescription medicines also offer help to patients who cannot afford the medicines that they need. Follow these instructions at home:  Take over-the-counter and prescription medicines only as told by your health care provider. Check with your health care provider before taking any new medicines.  Create structure and an organized atmosphere at home. For example: ? Make a list of tasks, then rank them from most important to least important. Work on one task at a time until your  listed tasks are done. ? Make a daily schedule and follow it consistently every day. ? Use an appointment calendar, and check it 2 or 3 times a day to keep on track. Keep it with you when you leave the house. ? Create spaces where you keep certain things, and always put things back in their places after you use them.  Keep all follow-up visits as told by your health care provider. This is important. Questions to ask your health care provider:  What are the risks and benefits of taking medicines?  Would I benefit from therapy?  How often should I follow up with a health care provider? Contact a health care provider if:  You have side effects from your medicines, such as: ? Repeated muscle twitches, coughing, or speech outbursts. ? Sleep problems. ? Loss of appetite. ? Depression. ? New or worsening behavior problems. ? Dizziness. ? Unusually fast heartbeat. ? Stomach  pains. ? Headaches. Get help right away if:  You have a severe reaction to a medicine.  Your behavior suddenly gets worse. Summary  With treatment and support, you can live with ADHD and manage your symptoms.  The medicines that are most often prescribed for ADHD are stimulants.  Consider taking part in family therapy or self-help groups with family members or friends.  When you talk with friends and family about your ADHD, be patient and communicate openly.  Take over-the-counter and prescription medicines only as told by your health care provider. Check with your health care provider before taking any new medicines. This information is not intended to replace advice given to you by your health care provider. Make sure you discuss any questions you have with your health care provider. Document Released: 04/21/2016 Document Revised: 04/21/2016 Document Reviewed: 04/21/2016 Elsevier Interactive Patient Education  2018 [place] all medications as directed. Continue healthy eating, regular exercise, and excellent water intake. Recommend complete physical with fasting labs in 4 weeks. WELCOME TO THE PRACTICE!

## 2017-06-05 NOTE — Assessment & Plan Note (Signed)
Recently started on Bupropion 163m QD, therapy started 4 weeks ago She reports minor decrease in overall anxiety, denies thoughts of harming herself/others Will modify dosage as needed

## 2017-06-05 NOTE — Progress Notes (Signed)
Subjective:    Patient ID: Michaela Bryant, female    DOB: 1975-09-14, 42 y.o.   MRN: [id number]  HPI"  Michaela Bryant is here to establish as a new pt.  She is a pleasant 42 year old female.  PMH: Raynaud's Syndrome, GAD/depression, ADD, bil lower ext edema, and pinched vertebral nerve.  She was recently started on Bupropion 150 mg QD, approx 4 weeks ago- she reports only minor reduction in overall anxiety.  She has been titrated up/down on Adderall 10-518m BID for several years and was recently increased to 364mBID. She takes HCTX 12.18m62mD for bil lower extremity edema. She will f/u with established neurosurgeon to address increase in bil upper ext numbness She exercises 5-7 days a week; running, crossfit, yoga, barre. She follows heart healthy diet, abstains from tobacco and rarely consumes ETOH She is going through divorce and has two daughters, ages 20 69d 13 71e reports strong local support system and denies thoughts of harming herself/others  Patient Care Team    Relationship Specialty Notifications Start End  [place]etta Fuller NP PCP - General Family Medicine  06/05/17   SteErline LevineD Consulting Physician Neurosurgery  06/05/17   ManJuanita CraverD Consulting Physician Gastroenterology  06/05/17   WhiHarriett SineD Consulting Physician Dermatology  06/05/17   CouServando SalinaD Consulting Physician Obstetrics and Gynecology  06/05/17   MarStephannie LiD [facility]  06/05/17     Patient Active Problem List   Diagnosis Date Noted  . ADD (attention deficit disorder) 06/05/2017  . Healthcare maintenance 06/05/2017  . GAD (generalized anxiety disorder) 06/05/2017  . Pinched vertebral nerve 06/05/2017  . Sepsis ([facility]CC[facility]1/03/2016  . Infertility 06/16/2012     Past Medical History:  Diagnosis Date  . ADD (attention deficit disorder)   . Celiac disease   . Depression   . Pinched vertebral nerve      History reviewed. No pertinent surgical history.   Family History   Problem Relation Age of Onset  . Alcohol abuse Mother   . Cancer Mother        breast  . Alcohol abuse Maternal Aunt   . Alcohol abuse Maternal Uncle   . Diabetes Maternal Grandmother   . Hypertension Maternal Grandmother   . Alcohol abuse Maternal Grandfather   . Hypertension Maternal Grandfather   . Stroke Paternal Grandfather      Social History   Substance and Sexual Activity  Drug Use No     Social History   Substance and Sexual Activity  Alcohol Use Yes  . Alcohol/week: 1.8 oz  . Types: 3 Glasses of wine per week     Social History   Tobacco Use  Smoking Status Never Smoker  Smokeless Tobacco Never Used     Outpatient Encounter Medications as of 06/05/2017  Medication Sig  . acetaminophen (TYLENOL) 500 MG tablet Take 1,000 mg by mouth every 6 (six) hours as needed for mild pain or headache.  . aMarland Kitchenidophilus (RISAQUAD) CAPS capsule Take 1 capsule by mouth daily.  . aMarland Kitchenphetamine-dextroamphetamine (ADDERALL) 30 MG tablet Take 1 tablet by mouth 2 (two) times daily.  . bMarland KitchenPROPion (WELLBUTRIN XL) 150 MG 24 hr tablet Take 1 tablet (150 mg total) by mouth daily.  . hydrochlorothiazide (MICROZIDE) 12.5 MG capsule TAKE 1 CAPSULE BY MOUTH DAILY IN THE MORNING AS NEEDED FOR SWELLING/FLUID  . ibuprofen (ADVIL,MOTRIN) 200 MG tablet Take 800 mg by mouth every 8 (eight) hours as needed for headache or  mild pain.   . Multiple Vitamin (MULTIVITAMIN) capsule Take 1 capsule by mouth daily.  . [DISCONTINUED] amphetamine-dextroamphetamine (ADDERALL) 30 MG tablet Take 30 mg by mouth daily.  . [DISCONTINUED] buPROPion (WELLBUTRIN XL) 150 MG 24 hr tablet Take 1 tablet by mouth daily.  . [DISCONTINUED] amphetamine-dextroamphetamine (ADDERALL) 10 MG tablet Take 10 mg by mouth 2 (two) times daily.  . [DISCONTINUED] linezolid (ZYVOX) 600 MG tablet Take 1 tablet (600 mg total) 2 (two) times daily by mouth.   No facility-administered encounter medications on file as of 06/05/2017.      Allergies: Gluten meal  Body mass index is 19.82 kg/m.  Blood pressure (!) 91/50, pulse 60, height 5' 4.25" (1.632 m), weight 116 lb 6.4 oz (52.8 kg), last menstrual period 05/30/2017, SpO2 96 %.  Review of Systems  Constitutional: Positive for fatigue. Negative for activity change, appetite change, chills, diaphoresis, fever and unexpected weight change.  Eyes: Negative for visual disturbance.  Respiratory: Negative for cough, chest tightness, shortness of breath, wheezing and stridor.   Cardiovascular: Negative for chest pain, palpitations and leg swelling.  Gastrointestinal: Negative for abdominal distention, abdominal pain, blood in stool, constipation, diarrhea, nausea and vomiting.  Endocrine: Negative for cold intolerance, heat intolerance, polydipsia, polyphagia and polyuria.  Genitourinary: Negative for difficulty urinating, flank pain, hematuria and urgency.  Musculoskeletal: Positive for arthralgias, myalgias and neck pain. Negative for back pain, gait problem and joint swelling.  Skin: Negative for color change, pallor, rash and wound.  Neurological: Negative for dizziness and headaches.  Hematological: Does not bruise/bleed easily.  Psychiatric/Behavioral: Positive for decreased concentration. Negative for behavioral problems, confusion, dysphoric mood, hallucinations, self-injury, sleep disturbance and suicidal ideas. The patient is nervous/anxious and is hyperactive.       Objective:   Physical Exam  Constitutional: She is oriented to person, place, and time. She appears well-developed and well-nourished. No distress.  HENT:  Head: Normocephalic.  Right Ear: External ear normal.  Left Ear: External ear normal.  Nose: Nose normal.  Mouth/Throat: Oropharynx is clear and moist.  Eyes: Pupils are equal, round, and reactive to light. Conjunctivae and EOM are normal.  Cardiovascular: Normal rate, regular rhythm, normal heart sounds and intact distal pulses.  No murmur  heard. Pulmonary/Chest: Effort normal and breath sounds normal. No stridor. No respiratory distress. She has no wheezes. She has no rales. She exhibits no tenderness.  Neurological: She is alert and oriented to person, place, and time.  Skin: Skin is warm and dry. Capillary refill takes less than 2 seconds. No rash noted. She is not diaphoretic. No erythema. No pallor.  Psychiatric: She has a normal mood and affect. Her behavior is normal. Judgment and thought content normal.  Nursing note and vitals reviewed.     Assessment & Plan:   1. Healthcare maintenance   2. Attention deficit hyperactivity disorder (ADHD), unspecified ADHD type   3. GAD (generalized anxiety disorder)   4. Pinched vertebral nerve     ADD (attention deficit disorder) [place] Controlled Substance Database reviewed- no aberrancies noted Adderall 93m BID- refill sent in Will refill 3 months at time, required OV every 90 days. She has been up/down titrated on Adderall BID from 10- 466mShe denies medication SE  Healthcare maintenance Continue all medications as directed. Continue healthy eating, regular exercise, and excellent water intake. Recommend complete physical with fasting labs in 4 weeks.  GAD (generalized anxiety disorder) Recently started on Bupropion 15069mD, therapy started 4 weeks ago She reports minor decrease in  overall anxiety, denies thoughts of harming herself/others Will modify dosage as needed  Pinched vertebral nerve Increased numbness/tingling in bil upper ext She will f/u with her established Neurosurgeon    FOLLOW-UP:  Return in about 1 month (around 07/03/2017) for CPE, Fasting Labs.

## 2017-06-05 NOTE — Assessment & Plan Note (Signed)
Continue all medications as directed. Continue healthy eating, regular exercise, and excellent water intake. Recommend complete physical with fasting labs in 4 weeks.

## 2017-06-05 NOTE — Assessment & Plan Note (Signed)
[place] Controlled Substance Database reviewed- no aberrancies noted Adderall 24m BID- refill sent in Will refill 3 months at time, required OV every 90 days. She has been up/down titrated on Adderall BID from 10- 442mShe denies medication SE

## 2017-06-05 NOTE — Assessment & Plan Note (Signed)
Increased numbness/tingling in bil upper ext She will f/u with her established Neurosurgeon

## 2017-06-13 ENCOUNTER — Ambulatory Visit (INDEPENDENT_AMBULATORY_CARE_PROVIDER_SITE_OTHER): Payer: 59 | Admitting: Podiatry

## 2017-06-13 ENCOUNTER — Encounter: Payer: Self-pay | Admitting: Podiatry

## 2017-06-13 DIAGNOSIS — B351 Tinea unguium: Secondary | ICD-10-CM

## 2017-06-13 DIAGNOSIS — L853 Xerosis cutis: Secondary | ICD-10-CM | POA: Diagnosis not present

## 2017-06-13 DIAGNOSIS — L603 Nail dystrophy: Secondary | ICD-10-CM | POA: Diagnosis not present

## 2017-06-13 NOTE — Patient Instructions (Signed)

## 2017-06-14 ENCOUNTER — Other Ambulatory Visit: Payer: Self-pay | Admitting: Adult Health

## 2017-06-14 DIAGNOSIS — H5213 Myopia, bilateral: Secondary | ICD-10-CM | POA: Diagnosis not present

## 2017-06-14 DIAGNOSIS — H524 Presbyopia: Secondary | ICD-10-CM | POA: Diagnosis not present

## 2017-06-14 DIAGNOSIS — H52223 Regular astigmatism, bilateral: Secondary | ICD-10-CM | POA: Diagnosis not present

## 2017-06-14 MED ORDER — TRAZODONE HCL 50 MG PO TABS: 25.0000 mg | ORAL_TABLET | Freq: Every evening | ORAL | 3 refills | Status: DC | PRN

## 2017-06-14 MED FILL — traZODone HCL 50 MG TABS: 50 | 30 days supply | Qty: 30 | Fill #0

## 2017-06-14 NOTE — Progress Notes (Signed)
Spoke with pt and she is struggling with insomnia. She reports very infrequent ETOH use and regular exercise. Trazodone 64m- 1/2 to 1 tab QHS PRN

## 2017-06-15 NOTE — Progress Notes (Signed)
Subjective:   Patient ID: Michaela Bryant, female   DOB: 42 y.o.   MRN: [id number]   HPI Michaela Bryant presents the office today for discussion in regards to her nails.  She states that she has thickening of her toenails and she has fungus and she is interested in treatment options including laser therapy for the nails.  The nails do not hurt but she is considerably well.  Is also concerned about fungus to her heels as she gets thick, callused skin to her heels as well and she tries moisturizer but does not help.  Denies any open sores or any skin fissuring.   Review of Systems  All other systems reviewed and are negative.  Past Medical History:  Diagnosis Date  . ADD (attention deficit disorder)   . Celiac disease   . Depression   . Pinched vertebral nerve     History reviewed. No pertinent surgical history.   Current Outpatient Medications:  .  acetaminophen (TYLENOL) 500 MG tablet, Take 1,000 mg by mouth every 6 (six) hours as needed for mild pain or headache., Disp: , Rfl:  .  acidophilus (RISAQUAD) CAPS capsule, Take 1 capsule by mouth daily., Disp: , Rfl:  .  amphetamine-dextroamphetamine (ADDERALL) 30 MG tablet, Take 1 tablet by mouth 2 (two) times daily., Disp: 60 tablet, Rfl: 0 .  buPROPion (WELLBUTRIN XL) 150 MG 24 hr tablet, Take 1 tablet (150 mg total) by mouth daily., Disp: 90 tablet, Rfl: 1 .  hydrochlorothiazide (MICROZIDE) 12.5 MG capsule, TAKE 1 CAPSULE BY MOUTH DAILY IN THE MORNING AS NEEDED FOR SWELLING/FLUID, Disp: , Rfl: 0 .  ibuprofen (ADVIL,MOTRIN) 200 MG tablet, Take 800 mg by mouth every 8 (eight) hours as needed for headache or mild pain. , Disp: , Rfl:  .  Multiple Vitamin (MULTIVITAMIN) capsule, Take 1 capsule by mouth daily., Disp: , Rfl:  .  traZODone (DESYREL) 50 MG tablet, Take 0.5-1 tablets (25-50 mg total) by mouth at bedtime as needed for sleep., Disp: 30 tablet, Rfl: 3  Allergies  Allergen Reactions  . Gluten Meal Nausea Only         Objective:   Physical Exam  General: AAO x3, NAD  Dermatological: Nails are hypertrophic, dystrophic, discolored with yellow-brown discoloration.  Nails especially the hallux toenails are somewhat loose and underlying nail bed and appeared to be damaged.  There is no surrounding redness or drainage or any signs of infection.  There is thick hyperkeratotic tissue with skin fissure to the heels there is no open sores or drainage.  Vascular: Dorsalis Pedis artery and Posterior Tibial artery pedal pulses are 2/4 bilateral with immedate capillary fill time. There is no pain with calf compression, swelling, warmth, erythema.   Neruologic: Grossly intact via light touch bilateral. Protective threshold with Semmes Wienstein monofilament intact to all pedal sites bilateral.   Musculoskeletal: No gross boney pedal deformities bilateral. No pain, crepitus, or limitation noted with foot and ankle range of motion bilateral. Muscular strength 5/5 in all groups tested bilateral.  Gait: Unassisted, Nonantalgic.       Assessment:   Onychomycosis with dry heels    Plan:  -Treatment options discussed including all alternatives, risks, and complications -Etiology of symptoms were discussed -Today after discussion I did debride the toenail sent this for culture to [facility] labs for evaluation of nail fungus.  Will likely do Lamisil. -I dispensed urea cream for the dry skin.  I do not think that the heels are fungus is warm and dry  skin but we do the oral treatment will treat that fungus as well potentially.  Trula Slade DPM

## 2017-06-19 DIAGNOSIS — Z131 Encounter for screening for diabetes mellitus: Secondary | ICD-10-CM | POA: Diagnosis not present

## 2017-06-19 DIAGNOSIS — F320 Major depressive disorder, single episode, mild: Secondary | ICD-10-CM | POA: Diagnosis not present

## 2017-06-19 DIAGNOSIS — R609 Edema, unspecified: Secondary | ICD-10-CM | POA: Diagnosis not present

## 2017-06-19 DIAGNOSIS — Z13220 Encounter for screening for lipoid disorders: Secondary | ICD-10-CM | POA: Diagnosis not present

## 2017-06-19 DIAGNOSIS — F988 Other specified behavioral and emotional disorders with onset usually occurring in childhood and adolescence: Secondary | ICD-10-CM | POA: Diagnosis not present

## 2017-06-19 DIAGNOSIS — Z0000 Encounter for general adult medical examination without abnormal findings: Secondary | ICD-10-CM | POA: Diagnosis not present

## 2017-06-19 LAB — CBC AND DIFFERENTIAL
HEMATOCRIT: 47 — AB (ref 36–46)
Hemoglobin: 15 (ref 12.0–16.0)
PLATELETS: 290 (ref 150–399)
WBC: 4.9

## 2017-06-19 LAB — HEPATIC FUNCTION PANEL
ALK PHOS: 45 (ref 25–125)
ALT: 10 (ref 7–35)
AST: 19 (ref 13–35)
BILIRUBIN, TOTAL: 0.4

## 2017-06-19 LAB — BASIC METABOLIC PANEL
BUN: 22 — AB (ref 4–21)
CREATININE: 0.8 (ref 0.5–1.1)
Glucose: 84
Potassium: 4.9 (ref 3.4–5.3)
SODIUM: 138 (ref 137–147)

## 2017-06-19 LAB — LIPID PANEL
CHOLESTEROL: 240 — AB (ref 0–200)
HDL: 105 — AB (ref 35–70)
LDL Cholesterol: 123
TRIGLYCERIDES: 59 (ref 40–160)

## 2017-06-28 DIAGNOSIS — D2262 Melanocytic nevi of left upper limb, including shoulder: Secondary | ICD-10-CM | POA: Diagnosis not present

## 2017-06-28 DIAGNOSIS — L814 Other melanin hyperpigmentation: Secondary | ICD-10-CM | POA: Diagnosis not present

## 2017-06-28 DIAGNOSIS — L718 Other rosacea: Secondary | ICD-10-CM | POA: Diagnosis not present

## 2017-06-28 DIAGNOSIS — D2261 Melanocytic nevi of right upper limb, including shoulder: Secondary | ICD-10-CM | POA: Diagnosis not present

## 2017-06-28 DIAGNOSIS — D2221 Melanocytic nevi of right ear and external auricular canal: Secondary | ICD-10-CM | POA: Diagnosis not present

## 2017-06-28 DIAGNOSIS — D225 Melanocytic nevi of trunk: Secondary | ICD-10-CM | POA: Diagnosis not present

## 2017-06-28 DIAGNOSIS — D2239 Melanocytic nevi of other parts of face: Secondary | ICD-10-CM | POA: Diagnosis not present

## 2017-06-28 MED FILL — metroNIDAZOLE 0.75 % GEL: 0.75 | 30 days supply | Qty: 45 | Fill #0

## 2017-07-03 MED FILL — DEXTROAMPH TB 30MG NSTR 100: 30 | 30 days supply | Qty: 60 | Fill #0

## 2017-07-25 MED FILL — traZODone HCL 50 MG TABS: 50 | 30 days supply | Qty: 30 | Fill #1

## 2017-08-01 MED FILL — AMPHETAMINE SALTS 30 MG TAB: 30 | 30 days supply | Qty: 60 | Fill #0

## 2017-08-13 ENCOUNTER — Other Ambulatory Visit: Payer: Self-pay | Admitting: Adult Health

## 2017-08-13 MED ORDER — ONDANSETRON 8 MG PO TBDP: 8.0000 mg | ORAL_TABLET | Freq: Three times a day (TID) | ORAL | 1 refills | Status: DC | PRN

## 2017-08-13 NOTE — Progress Notes (Signed)
Pt experiencing intermittent nausea without vomiting >5 days She denies fever

## 2017-08-14 MED FILL — ONDANSETRON ODT 8 MG TABLET: 8 | 7 days supply | Qty: 20 | Fill #0

## 2017-08-29 ENCOUNTER — Other Ambulatory Visit: Payer: Self-pay | Admitting: Adult Health

## 2017-08-29 MED FILL — DEXTROAMPH TB 30MG NSTR 100: 30 | 30 days supply | Qty: 60 | Fill #0

## 2017-09-06 DIAGNOSIS — G5603 Carpal tunnel syndrome, bilateral upper limbs: Secondary | ICD-10-CM | POA: Diagnosis not present

## 2017-09-06 DIAGNOSIS — M542 Cervicalgia: Secondary | ICD-10-CM | POA: Diagnosis not present

## 2017-09-06 DIAGNOSIS — R030 Elevated blood-pressure reading, without diagnosis of hypertension: Secondary | ICD-10-CM | POA: Diagnosis not present

## 2017-09-06 DIAGNOSIS — M5417 Radiculopathy, lumbosacral region: Secondary | ICD-10-CM | POA: Diagnosis not present

## 2017-09-06 DIAGNOSIS — I7300 Raynaud's syndrome without gangrene: Secondary | ICD-10-CM | POA: Diagnosis not present

## 2017-09-07 ENCOUNTER — Telehealth: Payer: Self-pay | Admitting: *Deleted

## 2017-09-07 MED ORDER — NONFORMULARY OR COMPOUNDED ITEM: 2 refills | Status: DC

## 2017-09-07 NOTE — Telephone Encounter (Signed)
-----   Message from Trula Slade, DPM sent at 09/06/2017  8:05 PM EDT ----- I was going through patients and she came to mind that I never got her [place]. Again, something that was not sent to me. Please apologize for the delay but the culture did not show fungus. We could say this is a false negative but it does show damage. Would try topical urea and oral biotin supplements. If she wanted to try a topical antifungal in case of false negative then we can do a topical through [facility].   Please make this a priority. Thank you.

## 2017-09-07 NOTE — Telephone Encounter (Signed)
I informed pt of Dr. Leigh Aurora review of results and orders. Pt states she would like to take the [place] Onychomycosis nail lacquer +urea. I explained [facility] would call with insurance coverage and delivery.

## 2017-09-07 NOTE — Telephone Encounter (Signed)
Faxed orders to [facility].

## 2017-09-12 MED FILL — buPROPion HCL ER (XL) 150 M: 150 | 90 days supply | Qty: 90 | Fill #1

## 2017-09-21 NOTE — Progress Notes (Signed)
Subjective:    Patient ID: Michaela Bryant, female    DOB: [birth year], 42 y.o.   MRN: [id number]  HPI"  [name] is here to establish as a new pt.  She is a pleasant 42 year old female.  PMH: Raynaud's Syndrome, GAD/depression, ADD, bil lower ext edema, and pinched vertebral nerve.  She was recently started on Bupropion 150 mg QD, approx 4 weeks ago- she reports only minor reduction in overall anxiety.  She has been titrated up/down on Adderall 10-27m BID for several years and was recently increased to 34mBID. She takes HCTX 12.63m18mD for bil lower extremity edema. She will f/u with established neurosurgeon to address increase in bil upper ext numbness She exercises 5-7 days a week; running, crossfit, yoga, barre. She follows heart healthy diet, abstains from tobacco and rarely consumes ETOH She is going through divorce and has two daughters, ages 20 77d 13 83e reports strong local support system and denies thoughts of harming herself/others  09/25/17 OV: [name] here for regular f/u: ADHD, GAD/Depression, and insomnia. She continues to exercise on a regular basis-  running, crossfit, yoga, barre. She reports medication compliance, denies SE, however feels that her concentration/focus worsens in afternoon. She continues to drink >75 oz water, follows a healthy diet, exercises regularly, abstains from tobacco use and rarely consumes ETOH She continues couple therapy with her husband-they are discussing reconcilliation She reports stable mood, denies thoughts of harming herself/others and has a strong support system of family/friends  Patient Care Team    Relationship Specialty Notifications Start End  DanMina Marble NP PCP - General Family Medicine  06/05/17   SteErline LevineD Consulting Physician Neurosurgery  06/05/17   ManJuanita CraverD Consulting Physician Gastroenterology  06/05/17   WhiHarriett SineD Consulting Physician Dermatology  06/05/17   CouServando SalinaD  Consulting Physician Obstetrics and Gynecology  06/05/17   MarStephannie LiD [facility]  06/05/17     Patient Active Problem List   Diagnosis Date Noted  . ADD (attention deficit disorder) 06/05/2017  . Healthcare maintenance 06/05/2017  . GAD (generalized anxiety disorder) 06/05/2017  . Pinched vertebral nerve 06/05/2017  . [place]     Past Medical History:  Diagnosis Date  . ADD (attention deficit disorder)   . Celiac disease   . Depression   . Pinched vertebral nerve      History reviewed. No pertinent surgical history.   Family History  Problem Relation Age of Onset  . Alcohol abuse Mother   . Cancer Mother        breast  . Alcohol abuse Maternal Aunt   . Alcohol abuse Maternal Uncle   . Diabetes Maternal Grandmother   . Hypertension Maternal Grandmother   . Alcohol abuse Maternal Grandfather   . Hypertension Maternal Grandfather   . Stroke Paternal Grandfather      Social History   Substance and Sexual Activity  Drug Use No     Social History   Substance and Sexual Activity  Alcohol Use Yes  . Alcohol/week: 3.0 standard drinks  . Types: 3 Glasses of wine per week     Social History   Tobacco Use  Smoking Status Never Smoker  Smokeless Tobacco Never Used     Outpatient Encounter Medications as of 09/25/2017  Medication Sig  . acetaminophen (TYLENOL) 500 MG tablet Take 1,000 mg by mouth every 6 (six) hours as needed for mild pain or headache.  .Marland Kitchen  acidophilus (RISAQUAD) CAPS capsule Take 1 capsule by mouth daily.  Marland Kitchen amphetamine-dextroamphetamine (ADDERALL) 30 MG tablet Take 1 tablet by mouth 2 (two) times daily.  Marland Kitchen buPROPion (WELLBUTRIN XL) 150 MG 24 hr tablet Take 1 tablet (150 mg total) by mouth daily.  . hydrochlorothiazide (MICROZIDE) 12.5 MG capsule TAKE 1 CAPSULE BY MOUTH DAILY IN THE MORNING AS NEEDED FOR SWELLING/FLUID  . ibuprofen (ADVIL,MOTRIN) 200 MG tablet Take 800 mg by mouth every 8 (eight) hours as needed for  headache or mild pain.   . Multiple Vitamin (MULTIVITAMIN) capsule Take 1 capsule by mouth daily.  . NONFORMULARY OR COMPOUNDED ITEM [facility]:  Onychomycosis Nail Lacquer - fluconazole 2%, terbinafine 1%, DMSO, +Urea 20%, apply to affected areas daily.  . ondansetron (ZOFRAN-ODT) 8 MG disintegrating tablet Take 1 tablet (8 mg total) by mouth every 8 (eight) hours as needed for nausea or vomiting.  . traZODone (DESYREL) 50 MG tablet Take 0.5-1 tablets (25-50 mg total) by mouth at bedtime as needed for sleep.  . [DISCONTINUED] amphetamine-dextroamphetamine (ADDERALL) 30 MG tablet TAKE 1 TABLET BY MOUTH TWICE DAILY  . [DISCONTINUED] traZODone (DESYREL) 50 MG tablet Take 0.5-1 tablets (25-50 mg total) by mouth at bedtime as needed for sleep.  Marland Kitchen amphetamine-dextroamphetamine (ADDERALL) 10 MG tablet Take at 2pm as needed for poor concentration/focus   No facility-administered encounter medications on file as of 09/25/2017.     Allergies: Gluten meal  Body mass index is 19.91 kg/m.  Blood pressure 104/70, pulse 68, height 5' 4.25" (1.632 m), weight 116 lb 14.4 oz (53 kg), last menstrual period 09/11/2017.  Review of Systems  Constitutional: Positive for fatigue. Negative for activity change, appetite change, chills, diaphoresis, fever and unexpected weight change.  Eyes: Negative for visual disturbance.  Respiratory: Negative for cough, chest tightness, shortness of breath, wheezing and stridor.   Cardiovascular: Negative for chest pain, palpitations and leg swelling.  Gastrointestinal: Negative for abdominal distention, abdominal pain, blood in stool, constipation, diarrhea, nausea and vomiting.  Endocrine: Negative for cold intolerance, heat intolerance, polydipsia, polyphagia and polyuria.  Genitourinary: Negative for difficulty urinating, flank pain, hematuria and urgency.  Musculoskeletal: Negative for arthralgias, back pain, gait problem, joint swelling, myalgias, neck pain and  neck stiffness.  Skin: Negative for color change, pallor, rash and wound.  Neurological: Negative for dizziness and headaches.  Hematological: Does not bruise/bleed easily.  Psychiatric/Behavioral: Positive for decreased concentration. Negative for behavioral problems, confusion, dysphoric mood, hallucinations, self-injury, sleep disturbance and suicidal ideas. The patient is nervous/anxious and is hyperactive.       Objective:   Physical Exam  Constitutional: She is oriented to person, place, and time. She appears well-developed and well-nourished. No distress.  HENT:  Head: Normocephalic.  Right Ear: External ear normal.  Left Ear: External ear normal.  Nose: Nose normal.  Mouth/Throat: Oropharynx is clear and moist.  Eyes: Pupils are equal, round, and reactive to light. Conjunctivae and EOM are normal.  Cardiovascular: Normal rate, regular rhythm, normal heart sounds and intact distal pulses.  No murmur heard. Pulmonary/Chest: Effort normal and breath sounds normal. No stridor. No respiratory distress. She has no wheezes. She has no rales. She exhibits no tenderness.  Neurological: She is alert and oriented to person, place, and time.  Skin: Skin is warm and dry. Capillary refill takes less than 2 seconds. No rash noted. She is not diaphoretic. No erythema. No pallor.  Psychiatric: She has a normal mood and affect. Her behavior is normal. Judgment and thought content normal.  Nursing note and vitals reviewed.     Assessment & Plan:   1. GAD (generalized anxiety disorder)   2. Attention deficit hyperactivity disorder (ADHD), unspecified ADHD type   3. Healthcare maintenance     ADD (attention deficit disorder) [place] Controlled Substance Database reviewed- no aberrancies noted Continue Adderall 78m BID, addition of Adderall 183mat 1400 PRN F/u 3 months  GAD (generalized anxiety disorder) Mood stable, denies thoughts of harming herself/others Continue Bupropion  15072mD She plans on establishing with therapist for one/one therapy She has continued with couple therapy   Healthcare maintenance Continue all medications as directed, only change use Adderall 56m15m 2pm as needed for poor concentration/focus. Continue to dink plenty of water and follow Mediterranean diet. If you need referral to Mental Health, please call clinic. Follow-up in 3 months with fasting labs.    FOLLOW-UP:  Return in about 3 months (around 12/25/2017) for Regular Follow Up, Fasting Labs, ADHD Medication, Depression.

## 2017-09-25 ENCOUNTER — Ambulatory Visit (INDEPENDENT_AMBULATORY_CARE_PROVIDER_SITE_OTHER): Payer: 59 | Admitting: Adult Health

## 2017-09-25 ENCOUNTER — Telehealth: Payer: Self-pay | Admitting: Adult Health

## 2017-09-25 ENCOUNTER — Encounter: Payer: Self-pay | Admitting: Adult Health

## 2017-09-25 ENCOUNTER — Ambulatory Visit: Payer: 59 | Admitting: Adult Health

## 2017-09-25 VITALS — BP 104/70 | HR 68 | Ht 64.25 in | Wt 116.9 lb

## 2017-09-25 DIAGNOSIS — F411 Generalized anxiety disorder: Secondary | ICD-10-CM

## 2017-09-25 DIAGNOSIS — Z0000 Encounter for general adult medical examination without abnormal findings: Secondary | ICD-10-CM | POA: Diagnosis not present

## 2017-09-25 DIAGNOSIS — F909 Attention-deficit hyperactivity disorder, unspecified type: Secondary | ICD-10-CM

## 2017-09-25 MED ORDER — AMPHETAMINE-DEXTROAMPHETAMINE 10 MG PO TABS: ORAL_TABLET | ORAL | 0 refills | Status: DC

## 2017-09-25 MED ORDER — AMPHETAMINE-DEXTROAMPHETAMINE 30 MG PO TABS: 1.0000 | ORAL_TABLET | Freq: Two times a day (BID) | ORAL | 0 refills | Status: DC

## 2017-09-25 MED ORDER — TRAZODONE HCL 50 MG PO TABS: 25.0000 mg | ORAL_TABLET | Freq: Every evening | ORAL | 3 refills | Status: DC | PRN

## 2017-09-25 MED FILL — traZODone HCL 50 MG TABS: 50 | 30 days supply | Qty: 30 | Fill #0

## 2017-09-25 MED FILL — AMPHETAMINE SALTS 30 MG TAB: 30 | 30 days supply | Qty: 60 | Fill #0

## 2017-09-25 MED FILL — AMPHETAMINE-DEXTROAMPHETAMI: 10 | 30 days supply | Qty: 30 | Fill #0

## 2017-09-25 NOTE — Assessment & Plan Note (Signed)
Mood stable, denies thoughts of harming herself/others Continue Bupropion 119m QD She plans on establishing with therapist for one/one therapy She has continued with couple therapy

## 2017-09-25 NOTE — Patient Instructions (Addendum)
Mediterranean Diet A Mediterranean diet refers to food and lifestyle choices that are based on the traditions of countries located on the [employer]. This way of eating has been shown to help prevent certain conditions and improve outcomes for people who have chronic diseases, like kidney disease and heart disease. What are tips for following this plan? Lifestyle  Cook and eat meals together with your family, when possible.  Drink enough fluid to keep your urine clear or pale yellow.  Be physically active every day. This includes: ? Aerobic exercise like running or swimming. ? Leisure activities like gardening, walking, or housework.  Get 7-8 hours of sleep each night.  If recommended by your health care provider, drink red wine in moderation. This means 1 glass a day for nonpregnant women and 2 glasses a day for men. A glass of wine equals 5 oz (150 mL). Reading food labels  Check the serving size of packaged foods. For foods such as rice and pasta, the serving size refers to the amount of cooked product, not dry.  Check the total fat in packaged foods. Avoid foods that have saturated fat or trans fats.  Check the ingredients list for added sugars, such as corn syrup. Shopping  At the grocery store, buy most of your food from the areas near the walls of the store. This includes: ? Fresh fruits and vegetables (produce). ? Grains, beans, nuts, and seeds. Some of these may be available in unpackaged forms or large amounts (in bulk). ? Fresh seafood. ? Poultry and eggs. ? Low-fat dairy products.  Buy whole ingredients instead of prepackaged foods.  Buy fresh fruits and vegetables in-season from local farmers markets.  Buy frozen fruits and vegetables in resealable bags.  If you do not have access to quality fresh seafood, buy precooked frozen shrimp or canned fish, such as tuna, salmon, or sardines.  Buy small amounts of raw or cooked vegetables, salads, or olives from the  deli or salad bar at your store.  Stock your pantry so you always have certain foods on hand, such as olive oil, canned tuna, canned tomatoes, rice, pasta, and beans. Cooking  Cook foods with extra-virgin olive oil instead of using butter or other vegetable oils.  Have meat as a side dish, and have vegetables or grains as your main dish. This means having meat in small portions or adding small amounts of meat to foods like pasta or stew.  Use beans or vegetables instead of meat in common dishes like chili or lasagna.  Experiment with different cooking methods. Try roasting or broiling vegetables instead of steaming or sauteing them.  Add frozen vegetables to soups, stews, pasta, or rice.  Add nuts or seeds for added healthy fat at each meal. You can add these to yogurt, salads, or vegetable dishes.  Marinate fish or vegetables using olive oil, lemon juice, garlic, and fresh herbs. Meal planning  Plan to eat 1 vegetarian meal one day each week. Try to work up to 2 vegetarian meals, if possible.  Eat seafood 2 or more times a week.  Have healthy snacks readily available, such as: ? Vegetable sticks with hummus. ? [place] yogurt. ? Fruit and nut trail mix.  Eat balanced meals throughout the week. This includes: ? Fruit: 2-3 servings a day ? Vegetables: 4-5 servings a day ? Low-fat dairy: 2 servings a day ? Fish, poultry, or lean meat: 1 serving a day ? Beans and legumes: 2 or more servings a week ? Nuts  and seeds: 1-2 servings a day ? Whole grains: 6-8 servings a day ? Extra-virgin olive oil: 3-4 servings a day  Limit red meat and sweets to only a few servings a month What are my food choices?  Mediterranean diet ? Recommended ? Grains: Whole-grain pasta. Brown rice. Bulgar wheat. Polenta. Couscous. Whole-wheat bread. [place]. ? Vegetables: Artichokes. Beets. Broccoli. Cabbage. Carrots. Eggplant. Green beans. Chard. Kale. Spinach. Onions. Leeks. Peas. Squash.  Tomatoes. Peppers. Radishes. ? Fruits: Apples. Apricots. Avocado. Berries. Bananas. Cherries. Dates. Figs. Grapes. Lemons. Melon. Oranges. Peaches. Plums. Pomegranate. ? Meats and other protein foods: Beans. Almonds. Sunflower seeds. Pine nuts. Peanuts. [place]. Salmon. Scallops. Shrimp. Concrete. Tilapia. Clams. Oysters. Eggs. ? Dairy: Low-fat milk. Cheese. Greek yogurt. ? Beverages: Water. Red wine. Herbal tea. ? Fats and oils: Extra virgin olive oil. Avocado oil. Grape seed oil. ? Sweets and desserts: [place] yogurt with honey. Baked apples. Poached pears. Trail mix. ? Seasoning and other foods: Basil. Cilantro. Coriander. Cumin. Mint. Parsley. Sage. Rosemary. Tarragon. Garlic. Oregano. Thyme. Pepper. Balsalmic vinegar. Tahini. Hummus. Tomato sauce. Olives. Mushrooms. ? Limit these ? Grains: Prepackaged pasta or rice dishes. Prepackaged cereal with added sugar. ? Vegetables: Deep fried potatoes (french fries). ? Fruits: Fruit canned in syrup. ? Meats and other protein foods: Beef. Pork. Lamb. Poultry with skin. Hot dogs. [place] Salines. ? Dairy: Ice cream. Sour cream. Whole milk. ? Beverages: Juice. Sugar-sweetened soft drinks. Beer. Liquor and spirits. ? Fats and oils: Butter. Canola oil. Vegetable oil. Beef fat (tallow). Lard. ? Sweets and desserts: Cookies. Cakes. Pies. Candy. ? Seasoning and other foods: Mayonnaise. Premade sauces and marinades. ? The items listed may not be a complete list. Talk with your dietitian about what dietary choices are right for you. Summary  The Mediterranean diet includes both food and lifestyle choices.  Eat a variety of fresh fruits and vegetables, beans, nuts, seeds, and whole grains.  Limit the amount of red meat and sweets that you eat.  Talk with your health care provider about whether it is safe for you to drink red wine in moderation. This means 1 glass a day for nonpregnant women and 2 glasses a day for men. A glass of wine equals 5 oz (150 mL). This information  is not intended to replace advice given to you by your health care provider. Make sure you discuss any questions you have with your health care provider. Document Released: 08/13/2015 Document Revised: 09/15/2015 Document Reviewed: 08/13/2015 Elsevier Interactive Patient Education  2018 [place] With Attention Deficit Hyperactivity Disorder If you have been diagnosed with attention deficit hyperactivity disorder (ADHD), you may be relieved that you now know why you have felt or behaved a certain way. Still, you may feel overwhelmed about the treatment ahead. You may also wonder how to get the support you need and how to deal with the condition day-to-day. With treatment and support, you can live with ADHD and manage your symptoms. How to manage lifestyle changes Managing stress Stress is your body's reaction to life changes and events, both good and bad. To cope with the stress of an ADHD diagnosis, it may help to:  Learn more about ADHD.  Exercise regularly. Even a short daily walk can lower stress levels.  Participate in training or education programs (including social skills training classes) that teach you to deal with symptoms.  Medicines Your health care provider may suggest certain medicines if he or she feels that they will help to improve your condition. Stimulant  medicines are usually prescribed to treat ADHD, and therapy may also be prescribed. It is important to:  Avoid using alcohol and other substances that may prevent your medicines from working properly [facility]).  Talk with your pharmacist or health care provider about all the medicines that you take, their possible side effects, and what medicines are safe to take together.  Make it your goal to take part in all treatment decisions (shared decision-making). Ask about possible side effects of medicines that your health care provider recommends, and tell him or her how you feel about having those side  effects. It is best if shared decision-making with your health care provider is part of your total treatment plan.  Relationships To strengthen your relationships with family members while treating your condition, consider taking part in family therapy. You might also attend self-help groups alone or with a loved one. Be honest about how your symptoms affect your relationships. Make an effort to communicate respectfully instead of fighting, and find ways to show others that you care. Psychotherapy may be useful in helping you cope with how ADHD affects your relationships. How to recognize changes in your condition The following signs may mean that your treatment is working well and your condition is improving:  Consistently being on time for appointments.  Being more organized at home and work.  Other people noticing improvements in your behavior.  Achieving goals that you set for yourself.  Thinking more clearly.  The following signs may mean that your treatment is not working very well:  Feeling impatience or more confusion.  Missing, forgetting, or being late for appointments.  An increasing sense of disorganization and messiness.  More difficulty in reaching goals that you set for yourself.  Loved ones becoming angry or frustrated with you.  Where to find support Talking to others  Keep emotion out of important discussions and speak in a calm, logical way.  Listen closely and patiently to your loved ones. Try to understand their point of view, and try to avoid getting defensive.  Take responsibility for the consequences of your actions.  Ask that others do not take your behaviors personally.  Aim to solve problems as they come up, and express your feelings instead of bottling them up.  Talk openly about what you need from your loved ones and how they can support you.  Consider going to family therapy sessions or having your family meet with a specialist who deals with  ADHD-related behavior problems. Finances Not all insurance plans cover mental health care, so it is important to check with your insurance carrier. If paying for co-pays or counseling services is a problem, search for a local or county mental health care center. Public mental health care services may be offered there at a low cost or no cost when you are not able to see a private health care provider. If you are taking medicine for ADHD, you may be able to get the generic form, which may be less expensive than brand-name medicine. Some makers of prescription medicines also offer help to patients who cannot afford the medicines that they need. Follow these instructions at home:  Take over-the-counter and prescription medicines only as told by your health care provider. Check with your health care provider before taking any new medicines.  Create structure and an organized atmosphere at home. For example: ? Make a list of tasks, then rank them from most important to least important. Work on one task at a time until your  listed tasks are done. ? Make a daily schedule and follow it consistently every day. ? Use an appointment calendar, and check it 2 or 3 times a day to keep on track. Keep it with you when you leave the house. ? Create spaces where you keep certain things, and always put things back in their places after you use them.  Keep all follow-up visits as told by your health care provider. This is important. Questions to ask your health care provider:  What are the risks and benefits of taking medicines?  Would I benefit from therapy?  How often should I follow up with a health care provider? Contact a health care provider if:  You have side effects from your medicines, such as: ? Repeated muscle twitches, coughing, or speech outbursts. ? Sleep problems. ? Loss of appetite. ? Depression. ? New or worsening behavior problems. ? Dizziness. ? Unusually fast heartbeat. ? Stomach  pains. ? Headaches. Get help right away if:  You have a severe reaction to a medicine.  Your behavior suddenly gets worse. Summary  With treatment and support, you can live with ADHD and manage your symptoms.  The medicines that are most often prescribed for ADHD are stimulants.  Consider taking part in family therapy or self-help groups with family members or friends.  When you talk with friends and family about your ADHD, be patient and communicate openly.  Take over-the-counter and prescription medicines only as told by your health care provider. Check with your health care provider before taking any new medicines. This information is not intended to replace advice given to you by your health care provider. Make sure you discuss any questions you have with your health care provider. Document Released: 04/21/2016 Document Revised: 04/21/2016 Document Reviewed: 04/21/2016 Elsevier Interactive Patient Education  2018 [place] all medications as directed, only change use Adderall 56m at 2pm as needed for poor concentration/focus. Continue to dink plenty of water and follow Mediterranean diet. If you need referral to Mental Health, please call clinic. Follow-up in 3 months with fasting labs. NICE TO SEE YOU!

## 2017-09-25 NOTE — Assessment & Plan Note (Signed)
[place] Controlled Substance Database reviewed- no aberrancies noted Continue Adderall 72m BID, addition of Adderall 110mat 1400 PRN F/u 3 months

## 2017-09-25 NOTE — Telephone Encounter (Signed)
[facility] needs order clarification on a prescription for this patient. Please call back Erin at the [facility] at [phone].

## 2017-09-25 NOTE — Assessment & Plan Note (Signed)
Continue all medications as directed, only change use Adderall 26m at 2pm as needed for poor concentration/focus. Continue to dink plenty of water and follow Mediterranean diet. If you need referral to Mental Health, please call clinic. Follow-up in 3 months with fasting labs.

## 2017-09-26 NOTE — Telephone Encounter (Signed)
Pharmacy wanted to ensure that pt is supposed to be taking Adderall 72m BID AND Adderall 176mat 2pm PRN.  Advised pharmacy that Michaela MarbleNP is aware of this dosing regimen.  T.Charyl BiggerCMA

## 2017-09-27 ENCOUNTER — Ambulatory Visit: Payer: 59 | Admitting: Adult Health

## 2017-10-12 MED FILL — TRIAMCINOLONE 0.5% CREAM: 0.5 | 10 days supply | Qty: 30 | Fill #0

## 2017-10-23 ENCOUNTER — Other Ambulatory Visit: Payer: Self-pay | Admitting: Adult Health

## 2017-10-23 MED ORDER — AMPHETAMINE-DEXTROAMPHETAMINE 10 MG PO TABS: ORAL_TABLET | ORAL | 0 refills | Status: DC

## 2017-10-23 MED ORDER — AMPHETAMINE-DEXTROAMPHETAMINE 30 MG PO TABS: 1.0000 | ORAL_TABLET | Freq: Two times a day (BID) | ORAL | 0 refills | Status: DC

## 2017-10-23 MED FILL — AMPHETAMINE SALTS 30 MG TAB: 30 | 30 days supply | Qty: 60 | Fill #0

## 2017-10-23 MED FILL — AMPHETAMINE-DEXTROAMPHETAMI: 10 | 30 days supply | Qty: 30 | Fill #0

## 2017-11-20 ENCOUNTER — Telehealth: Payer: Self-pay | Admitting: Adult Health

## 2017-11-20 ENCOUNTER — Other Ambulatory Visit: Payer: Self-pay | Admitting: Adult Health

## 2017-11-20 MED ORDER — AMPHETAMINE-DEXTROAMPHETAMINE 10 MG PO TABS: ORAL_TABLET | ORAL | 0 refills | Status: DC

## 2017-11-20 MED ORDER — AMPHETAMINE-DEXTROAMPHETAMINE 30 MG PO TABS: 1.0000 | ORAL_TABLET | Freq: Two times a day (BID) | ORAL | 0 refills | Status: DC

## 2017-11-20 NOTE — Telephone Encounter (Signed)
Michaela Bryant from [facility] [phone] states Rx refill rejections for :  amphetamine-dextroamphetamine (ADDERALL) 10 MG tablet [[id number]]   Order Details  Dose, Route, Frequency: As Directed   Indications of Use: Attention Deficit Hyperactivity Disorder  Dispense Quantity: 30 tablet Refills: 0 Fills remaining: --        Sig: Take at 2pm as needed for poor concentration/focus          &   amphetamine-dextroamphetamine (ADDERALL) 30 MG tablet [[id number]]   Order Details  Dose: 1 tablet Route: Oral Frequency: 2 times daily  Indications of Use: Attention Deficit Hyperactivity Disorder  Dispense Quantity: 60 tablet Refills: 0 Fills remaining: --        Sig: Take 1 tablet by mouth 2 (two) times daily.     ---Forwarding message to medical assistant to contact Michaela Bryant @ [phone]  --glh

## 2017-11-20 NOTE — Telephone Encounter (Signed)
Spoke with [facility] regarding Adderall RXs.  Pharmacist states that pt has to have these medications filled at a [facility] pharmacy, rather than [facility] where [facility]ills were sent earlier today.  However, I contacted [facility] to [place] sent earlier today and was told by "Ebony Hail" that pt picked up these prescriptions earlier today.  Charyl Bigger, CMA

## 2017-12-18 ENCOUNTER — Other Ambulatory Visit: Payer: Self-pay | Admitting: Adult Health

## 2017-12-18 MED ORDER — AMPHETAMINE-DEXTROAMPHETAMINE 30 MG PO TABS: 1.0000 | ORAL_TABLET | Freq: Two times a day (BID) | ORAL | 0 refills | Status: DC

## 2017-12-18 MED ORDER — AMPHETAMINE-DEXTROAMPHETAMINE 10 MG PO TABS: ORAL_TABLET | ORAL | 0 refills | Status: DC

## 2017-12-18 MED FILL — AMPHETAMINE-DEXTROAMPHETAMI: 10 | 30 days supply | Qty: 30 | Fill #0

## 2017-12-18 MED FILL — AMPHETAMINE SALTS 30 MG TAB: 30 | 30 days supply | Qty: 60 | Fill #0

## 2017-12-22 ENCOUNTER — Ambulatory Visit: Payer: 59 | Admitting: Adult Health

## 2017-12-25 ENCOUNTER — Encounter: Payer: Self-pay | Admitting: Adult Health

## 2017-12-25 ENCOUNTER — Ambulatory Visit (INDEPENDENT_AMBULATORY_CARE_PROVIDER_SITE_OTHER): Payer: 59 | Admitting: Adult Health

## 2017-12-25 VITALS — BP 143/98 | HR 89 | Temp 98.4°F | Ht 64.25 in | Wt 115.6 lb

## 2017-12-25 DIAGNOSIS — L659 Nonscarring hair loss, unspecified: Secondary | ICD-10-CM | POA: Diagnosis not present

## 2017-12-25 DIAGNOSIS — F411 Generalized anxiety disorder: Secondary | ICD-10-CM | POA: Diagnosis not present

## 2017-12-25 DIAGNOSIS — Z0000 Encounter for general adult medical examination without abnormal findings: Secondary | ICD-10-CM

## 2017-12-25 DIAGNOSIS — F909 Attention-deficit hyperactivity disorder, unspecified type: Secondary | ICD-10-CM | POA: Diagnosis not present

## 2017-12-25 DIAGNOSIS — R5383 Other fatigue: Secondary | ICD-10-CM | POA: Diagnosis not present

## 2017-12-25 NOTE — Patient Instructions (Addendum)
Living With Attention Deficit Hyperactivity Disorder If you have been diagnosed with attention deficit hyperactivity disorder (ADHD), you may be relieved that you now know why you have felt or behaved a certain way. Still, you may feel overwhelmed about the treatment ahead. You may also wonder how to get the support you need and how to deal with the condition day-to-day. With treatment and support, you can live with ADHD and manage your symptoms. How to manage lifestyle changes Managing stress Stress is your body's reaction to life changes and events, both good and bad. To cope with the stress of an ADHD diagnosis, it may help to:  Learn more about ADHD.  Exercise regularly. Even a short daily walk can lower stress levels.  Participate in training or education programs (including social skills training classes) that teach you to deal with symptoms.  Medicines Your health care provider may suggest certain medicines if he or she feels that they will help to improve your condition. Stimulant medicines are usually prescribed to treat ADHD, and therapy may also be prescribed. It is important to:  Avoid using alcohol and other substances that may prevent your medicines from working properly [facility]).  Talk with your pharmacist or health care provider about all the medicines that you take, their possible side effects, and what medicines are safe to take together.  Make it your goal to take part in all treatment decisions (shared decision-making). Ask about possible side effects of medicines that your health care provider recommends, and tell him or her how you feel about having those side effects. It is best if shared decision-making with your health care provider is part of your total treatment plan. Relationships To strengthen your relationships with family members while treating your condition, consider taking part in family therapy. You might also attend self-help groups alone or with a  loved one. Be honest about how your symptoms affect your relationships. Make an effort to communicate respectfully instead of fighting, and find ways to show others that you care. Psychotherapy may be useful in helping you cope with how ADHD affects your relationships. How to recognize changes in your condition The following signs may mean that your treatment is working well and your condition is improving:  Consistently being on time for appointments.  Being more organized at home and work.  Other people noticing improvements in your behavior.  Achieving goals that you set for yourself.  Thinking more clearly. The following signs may mean that your treatment is not working very well:  Feeling impatience or more confusion.  Missing, forgetting, or being late for appointments.  An increasing sense of disorganization and messiness.  More difficulty in reaching goals that you set for yourself.  Loved ones becoming angry or frustrated with you. Where to find support Talking to others  Keep emotion out of important discussions and speak in a calm, logical way.  Listen closely and patiently to your loved ones. Try to understand their point of view, and try to avoid getting defensive.  Take responsibility for the consequences of your actions.  Ask that others do not take your behaviors personally.  Aim to solve problems as they come up, and express your feelings instead of bottling them up.  Talk openly about what you need from your loved ones and how they can support you.  Consider going to family therapy sessions or having your family meet with a specialist who deals with ADHD-related behavior problems. Finances Not all insurance plans cover mental health care,  so it is important to check with your insurance carrier. If paying for co-pays or counseling services is a problem, search for a local or county mental health care center. Public mental health care services may be offered  there at a low cost or no cost when you are not able to see a private health care provider. If you are taking medicine for ADHD, you may be able to get the generic form, which may be less expensive than brand-name medicine. Some makers of prescription medicines also offer help to patients who cannot afford the medicines that they need. Follow these instructions at home:  Take over-the-counter and prescription medicines only as told by your health care provider. Check with your health care provider before taking any new medicines.  Create structure and an organized atmosphere at home. For example: ? Make a list of tasks, then rank them from most important to least important. Work on one task at a time until your listed tasks are done. ? Make a daily schedule and follow it consistently every day. ? Use an appointment calendar, and check it 2 or 3 times a day to keep on track. Keep it with you when you leave the house. ? Create spaces where you keep certain things, and always put things back in their places after you use them.  Keep all follow-up visits as told by your health care provider. This is important. Questions to ask your health care provider:  What are the risks and benefits of taking medicines?  Would I benefit from therapy?  How often should I follow up with a health care provider? Contact a health care provider if:  You have side effects from your medicines, such as: ? Repeated muscle twitches, coughing, or speech outbursts. ? Sleep problems. ? Loss of appetite. ? Depression. ? New or worsening behavior problems. ? Dizziness. ? Unusually fast heartbeat. ? Stomach pains. ? Headaches. Get help right away if:  You have a severe reaction to a medicine.  Your behavior suddenly gets worse. Summary  With treatment and support, you can live with ADHD and manage your symptoms.  The medicines that are most often prescribed for ADHD are stimulants.  Consider taking part in  family therapy or self-help groups with family members or friends.  When you talk with friends and family about your ADHD, be patient and communicate openly.  Take over-the-counter and prescription medicines only as told by your health care provider. Check with your health care provider before taking any new medicines. This information is not intended to replace advice given to you by your health care provider. Make sure you discuss any questions you have with your health care provider. Document Released: 04/21/2016 Document Revised: 04/21/2016 Document Reviewed: 04/21/2016 Elsevier Interactive Patient Education  2019 [place] refers to food and lifestyle choices that are based on the traditions of countries located on the [employer]. This way of eating has been shown to help prevent certain conditions and improve outcomes for people who have chronic diseases, like kidney disease and heart disease. What are tips for following this plan? Lifestyle  Cook and eat meals together with your family, when possible.  Drink enough fluid to keep your urine clear or pale yellow.  Be physically active every day. This includes: ? Aerobic exercise like running or swimming. ? Leisure activities like gardening, walking, or housework.  Get 7-8 hours of sleep each night.  If recommended by your health care  provider, drink red wine in moderation. This means 1 glass a day for nonpregnant women and 2 glasses a day for men. A glass of wine equals 5 oz (150 mL). Reading food labels   Check the serving size of packaged foods. For foods such as rice and pasta, the serving size refers to the amount of cooked product, not dry.  Check the total fat in packaged foods. Avoid foods that have saturated fat or trans fats.  Check the ingredients list for added sugars, such as corn syrup. Shopping  At the grocery store, buy most of your food from the areas near  the walls of the store. This includes: ? Fresh fruits and vegetables (produce). ? Grains, beans, nuts, and seeds. Some of these may be available in unpackaged forms or large amounts (in bulk). ? Fresh seafood. ? Poultry and eggs. ? Low-fat dairy products.  Buy whole ingredients instead of prepackaged foods.  Buy fresh fruits and vegetables in-season from local farmers markets.  Buy frozen fruits and vegetables in resealable bags.  If you do not have access to quality fresh seafood, buy precooked frozen shrimp or canned fish, such as tuna, salmon, or sardines.  Buy small amounts of raw or cooked vegetables, salads, or olives from the deli or salad bar at your store.  Stock your pantry so you always have certain foods on hand, such as olive oil, canned tuna, canned tomatoes, rice, pasta, and beans. Cooking  Cook foods with extra-virgin olive oil instead of using butter or other vegetable oils.  Have meat as a side dish, and have vegetables or grains as your main dish. This means having meat in small portions or adding small amounts of meat to foods like pasta or stew.  Use beans or vegetables instead of meat in common dishes like chili or lasagna.  Experiment with different cooking methods. Try roasting or broiling vegetables instead of steaming or sauteing them.  Add frozen vegetables to soups, stews, pasta, or rice.  Add nuts or seeds for added healthy fat at each meal. You can add these to yogurt, salads, or vegetable dishes.  Marinate fish or vegetables using olive oil, lemon juice, garlic, and fresh herbs. Meal planning   Plan to eat 1 vegetarian meal one day each week. Try to work up to 2 vegetarian meals, if possible.  Eat seafood 2 or more times a week.  Have healthy snacks readily available, such as: ? Vegetable sticks with hummus. ? [place] yogurt. ? Fruit and nut trail mix.  Eat balanced meals throughout the week. This includes: ? Fruit: 2-3 servings a  day ? Vegetables: 4-5 servings a day ? Low-fat dairy: 2 servings a day ? Fish, poultry, or lean meat: 1 serving a day ? Beans and legumes: 2 or more servings a week ? Nuts and seeds: 1-2 servings a day ? Whole grains: 6-8 servings a day ? Extra-virgin olive oil: 3-4 servings a day  Limit red meat and sweets to only a few servings a month What are my food choices?  Mediterranean diet ? Recommended ? Grains: Whole-grain pasta. Brown rice. Bulgar wheat. Polenta. Couscous. Whole-wheat bread. [place]. ? Vegetables: Artichokes. Beets. Broccoli. Cabbage. Carrots. Eggplant. Green beans. Chard. Kale. Spinach. Onions. Leeks. Peas. Squash. Tomatoes. Peppers. Radishes. ? Fruits: Apples. Apricots. Avocado. Berries. Bananas. Cherries. Dates. Figs. Grapes. Lemons. Melon. Oranges. Peaches. Plums. Pomegranate. ? Meats and other protein foods: Beans. Almonds. Sunflower seeds. Pine nuts. Peanuts. [place]. Salmon. Scallops. Shrimp. Daisy. Tilapia. Clams. Oysters. Eggs. ?  Dairy: Low-fat milk. Cheese. Greek yogurt. ? Beverages: Water. Red wine. Herbal tea. ?   ?  ? Fats and oils: Extra virgin olive oil. Avocado oil. Grape seed oil. ? Sweets and desserts: [place] yogurt with honey. Baked apples. Poached pears. Trail mix. ? Seasoning and other foods: Basil. Cilantro. Coriander. Cumin. Mint. Parsley. Sage. Rosemary. Tarragon. Garlic. Oregano. Thyme. Pepper. Balsalmic vinegar. Tahini. Hummus. Tomato sauce. Olives. Mushrooms. ? Limit these ? Grains: Prepackaged pasta or rice dishes. Prepackaged cereal with added sugar. ? Vegetables: Deep fried potatoes (french fries). ? Fruits: Fruit canned in syrup. ? Meats and other protein foods: Beef. Pork. Lamb. Poultry with skin. Hot dogs. [place] Salines. ? Dairy: Ice cream. Sour cream. Whole milk. ? Beverages: Juice. Sugar-sweetened soft drinks. Beer. Liquor and spirits. ? Fats and oils: Butter. Canola oil. Vegetable oil. Beef fat (tallow). Lard. ? Sweets and desserts:  Cookies. Cakes. Pies. Candy. ? Seasoning and other foods: Mayonnaise. Premade sauces and marinades. ? The items listed may not be a complete list. Talk with your dietitian about what dietary choices are right for you. Summary  The Mediterranean diet includes both food and lifestyle choices.  Eat a variety of fresh fruits and vegetables, beans, nuts, seeds, and whole grains.  Limit the amount of red meat and sweets that you eat.  Talk with your health care provider about whether it is safe for you to drink red wine in moderation. This means 1 glass a day for nonpregnant women and 2 glasses a day for men. A glass of wine equals 5 oz (150 mL). This information is not intended to replace advice given to you by your health care provider. Make sure you discuss any questions you have with your health care provider. Document Released: 08/13/2015 Document Revised: 09/15/2015 Document Reviewed: 08/13/2015 Elsevier Interactive Patient Education  2019 [place] all medications as directed. Increase water intake and follow Mediterranean diet. Continue regular exercise. We will call you when lab results are available. If blood pressure is above goal at next office visit, will discuss anti-hypertensive therapy.  Follow-up in 3 months. HAPPY HOLIDAYS!!!! GREAT TO SEE YOU!

## 2017-12-25 NOTE — Assessment & Plan Note (Addendum)
Pt has not used Wellbutrin in months, not interested in re-starting Reports recent increase in anxiety due to divorce proceedings, but handling it well with regular exercise, proper diet, strong support system

## 2017-12-25 NOTE — Assessment & Plan Note (Signed)
TSH drawn

## 2017-12-25 NOTE — Progress Notes (Signed)
Subjective:    Patient ID: Michaela Bryant dam, female    DOB: 10/05/1975, 42 y.o.   MRN: [id number]  HPI"  Michaela Bryant is here to establish as a new pt.  She is a pleasant 42 year old female.  PMH: Raynaud's Syndrome, GAD/depression, ADD, bil lower ext edema, and pinched vertebral nerve.  She was recently started on Bupropion 150 mg QD, approx 4 weeks ago- she reports only minor reduction in overall anxiety.  She has been titrated up/down on Adderall 10-34m BID for several years and was recently increased to 347mBID. She takes HCTX 12.3m42mD for bil lower extremity edema. She will f/u with established neurosurgeon to address increase in bil upper ext numbness She exercises 5-7 days a week; running, crossfit, yoga, barre. She follows heart healthy diet, abstains from tobacco and rarely consumes ETOH She is going through divorce and has two daughters, ages 20 67d 13 58e reports strong local support system and denies thoughts of harming herself/others  09/25/17 OV: Michaela Bryant here for regular f/u: ADHD, GAD/Depression, and insomnia. She continues to exercise on a regular basis-  running, crossfit, yoga, barre. She reports medication compliance, denies SE, however feels that her concentration/focus worsens in afternoon. She continues to drink >75 oz water, follows a healthy diet, exercises regularly, abstains from tobacco use and rarely consumes ETOH She continues couple therapy with her husband-they are discussing reconcilliation She reports stable mood, denies thoughts of harming herself/others and has a strong support system of family/friends  12/25/17 OV: Michaela Bryant for regular follow: ADHD and new onset of hair loss and fatigue the last few months. TSH level has not be drawn in years She estimates to drink 40-68 oz water/day, reports eating a heart healthy diet. She exercises 5-7 days week- crossift, yoga, pure barre, running She reports excellent focus and concentration on  Adderall 52m12mM, and will only need to use Aderrall 10mg13mafternoon 2-3 times/week She denies CP/dyspnea/dizziness/HA/palpitations/profuse sweating She denies tobacco/vape/excessive ETOH use She reports increased stress/anxiety- proceeding with divorce and dealing with the settlement issues. She reports strong support system  Patient Care Team    Relationship Specialty Notifications Start End  DanfoMina MarbleP PCP - General Family Medicine  06/05/17   SternErline LevineConsulting Physician Neurosurgery  06/05/17   Mann,Juanita CraverConsulting Physician Gastroenterology  06/05/17   WhitwHarriett SineConsulting Physician Dermatology  06/05/17   CousiServando SalinaConsulting Physician Obstetrics and Gynecology  06/05/17   MartiStephannie Li ODeep Riverthalmology  06/05/17     Patient Active Problem List   Diagnosis Date Noted  . Fatigue 12/25/2017  . Hair loss 12/25/2017  . ADD (attention deficit disorder) 06/05/2017  . Healthcare maintenance 06/05/2017  . GAD (generalized anxiety disorder) 06/05/2017  . Pinched vertebral nerve 06/05/2017  . Sepsis (HCC) [place]03/2018     Past Medical History:  Diagnosis Date  . ADD (attention deficit disorder)   . Celiac disease   . Depression   . Pinched vertebral nerve      History reviewed. No pertinent surgical history.   Family History  Problem Relation Age of Onset  . Alcohol abuse Mother   . Cancer Mother        breast  . Alcohol abuse Maternal Aunt   . Alcohol abuse Maternal Uncle   . Diabetes Maternal Grandmother   . Hypertension Maternal Grandmother   . Alcohol abuse Maternal Grandfather   . Hypertension Maternal  Grandfather   . Stroke Paternal Grandfather      Social History   Substance and Sexual Activity  Drug Use No     Social History   Substance and Sexual Activity  Alcohol Use Yes  . Alcohol/week: 3.0 standard drinks  . Types: 3 Glasses of wine per week     Social History   Tobacco Use  Smoking Status  Never Smoker  Smokeless Tobacco Never Used     Outpatient Encounter Medications as of 12/25/2017  Medication Sig  . acetaminophen (TYLENOL) 500 MG tablet Take 1,000 mg by mouth every 6 (six) hours as needed for mild pain or headache.  Marland Kitchen acidophilus (RISAQUAD) CAPS capsule Take 1 capsule by mouth daily.  Marland Kitchen amphetamine-dextroamphetamine (ADDERALL) 10 MG tablet Take at 2pm as needed for poor concentration/focus  . amphetamine-dextroamphetamine (ADDERALL) 30 MG tablet Take 1 tablet by mouth 2 (two) times daily.  . hydrochlorothiazide (MICROZIDE) 12.5 MG capsule TAKE 1 CAPSULE BY MOUTH DAILY IN THE MORNING AS NEEDED FOR SWELLING/FLUID  . ibuprofen (ADVIL,MOTRIN) 200 MG tablet Take 800 mg by mouth every 8 (eight) hours as needed for headache or mild pain.   . Multiple Vitamin (MULTIVITAMIN) capsule Take 1 capsule by mouth daily.  . NONFORMULARY OR COMPOUNDED ITEM [facility]:  Onychomycosis Nail Lacquer - fluconazole 2%, terbinafine 1%, DMSO, +Urea 20%, apply to affected areas daily.  . ondansetron (ZOFRAN-ODT) 8 MG disintegrating tablet Take 1 tablet (8 mg total) by mouth every 8 (eight) hours as needed for nausea or vomiting.  . traZODone (DESYREL) 50 MG tablet Take 0.5-1 tablets (25-50 mg total) by mouth at bedtime as needed for sleep.  . [DISCONTINUED] buPROPion (WELLBUTRIN XL) 150 MG 24 hr tablet Take 1 tablet (150 mg total) by mouth daily.   No facility-administered encounter medications on file as of 12/25/2017.     Allergies: Gluten meal  Body mass index is 19.69 kg/m.  Blood pressure (!) 143/98, pulse 89, temperature 98.4 F (36.9 C), temperature source Oral, height 5' 4.25" (1.632 m), weight 115 lb 9.6 oz (52.4 kg), last menstrual period 12/21/2017, SpO2 99 %.  Review of Systems  Constitutional: Positive for fatigue. Negative for activity change, appetite change, chills, diaphoresis, fever and unexpected weight change.  Eyes: Negative for visual disturbance.  Respiratory:  Negative for cough, chest tightness, shortness of breath, wheezing and stridor.   Cardiovascular: Negative for chest pain, palpitations and leg swelling.  Gastrointestinal: Negative for abdominal distention, abdominal pain, blood in stool, constipation, diarrhea, nausea and vomiting.  Endocrine: Negative for cold intolerance, heat intolerance, polydipsia, polyphagia and polyuria.  Genitourinary: Negative for difficulty urinating, flank pain, hematuria and urgency.  Musculoskeletal: Negative for arthralgias, back pain, gait problem, joint swelling, myalgias, neck pain and neck stiffness.  Skin: Negative for color change, pallor, rash and wound.  Neurological: Negative for dizziness and headaches.  Hematological: Does not bruise/bleed easily.  Psychiatric/Behavioral: Positive for decreased concentration. Negative for behavioral problems, confusion, dysphoric mood, hallucinations, self-injury, sleep disturbance and suicidal ideas. The patient is nervous/anxious and is hyperactive.       Objective:   Physical Exam Vitals signs and nursing note reviewed.  Constitutional:      General: She is not in acute distress.    Appearance: She is well-developed. She is not diaphoretic.  HENT:     Head: Normocephalic.     Right Ear: External ear normal.     Left Ear: External ear normal.     Nose: Nose normal.  Eyes:  Conjunctiva/sclera: Conjunctivae normal.     Pupils: Pupils are equal, round, and reactive to light.  Cardiovascular:     Rate and Rhythm: Normal rate and regular rhythm.     Heart sounds: Normal heart sounds. No murmur.  Pulmonary:     Effort: Pulmonary effort is normal. No respiratory distress.     Breath sounds: Normal breath sounds. No stridor. No wheezing or rales.  Chest:     Chest wall: No tenderness.  Skin:    General: Skin is warm and dry.     Capillary Refill: Capillary refill takes less than 2 seconds.     Coloration: Skin is not pale.     Findings: No erythema or  rash.  Neurological:     Mental Status: She is alert and oriented to person, place, and time.  Psychiatric:        Behavior: Behavior normal.        Thought Content: Thought content normal.        Judgment: Judgment normal.       Assessment & Plan:   1. Fatigue, unspecified type   2. Hair loss   3. Healthcare maintenance   4. GAD (generalized anxiety disorder)   5. Attention deficit hyperactivity disorder (ADHD), unspecified ADHD type     Healthcare maintenance Continue all medications as directed. Increase water intake and follow Mediterranean diet. Continue regular exercise. We will call you when lab results are available. If blood pressure is above goal at next office visit, will discuss anti-hypertensive therapy.   GAD (generalized anxiety disorder) Pt has not used Wellbutrin in months, not interested in re-starting Reports recent increase in anxiety due to divorce proceedings, but handling it well with regular exercise, proper diet, strong support system   Fatigue TSH drawn   Hair loss TSH drawn   ADD (attention deficit disorder) [place] Controlled Substance Database reviewed- no aberrancies noted Continue Adderall 44m QD, Adderall 134mPRN afternoon     FOLLOW-UP:  Return in about 3 months (around 03/26/2018) for Regular Follow Up.

## 2017-12-25 NOTE — Assessment & Plan Note (Signed)
Continue all medications as directed. Increase water intake and follow Mediterranean diet. Continue regular exercise. We will call you when lab results are available. If blood pressure is above goal at next office visit, will discuss anti-hypertensive therapy.

## 2017-12-25 NOTE — Assessment & Plan Note (Signed)
[place] Controlled Substance Database reviewed- no aberrancies noted Continue Adderall 50m QD, Adderall 129mPRN afternoon

## 2017-12-26 LAB — TSH: TSH: 2.88 u[IU]/mL (ref 0.450–4.500)

## 2018-01-04 ENCOUNTER — Other Ambulatory Visit: Payer: Self-pay | Admitting: Adult Health

## 2018-01-04 MED ORDER — ONDANSETRON 8 MG PO TBDP: 8.0000 mg | ORAL_TABLET | Freq: Three times a day (TID) | ORAL | 1 refills | Status: DC | PRN

## 2018-01-04 MED FILL — ONDANSETRON ODT 8 MG TABLET: 8 | 6 days supply | Qty: 20 | Fill #0

## 2018-01-15 ENCOUNTER — Other Ambulatory Visit: Payer: Self-pay | Admitting: Adult Health

## 2018-01-15 MED ORDER — AMPHETAMINE-DEXTROAMPHETAMINE 10 MG PO TABS: ORAL_TABLET | ORAL | 0 refills | Status: DC

## 2018-01-15 MED ORDER — AMPHETAMINE-DEXTROAMPHETAMINE 30 MG PO TABS: 1.0000 | ORAL_TABLET | Freq: Two times a day (BID) | ORAL | 0 refills | Status: DC

## 2018-01-15 MED FILL — AMPHETAMINE-DEXTROAMPHETAMI: 10 | 30 days supply | Qty: 30 | Fill #0

## 2018-01-15 MED FILL — AMPHETAMINE SALTS 30 MG TAB: 30 | 30 days supply | Qty: 60 | Fill #0

## 2018-01-29 MED FILL — traZODone HCL 50 MG TABS: 50 | 30 days supply | Qty: 30 | Fill #1

## 2018-01-29 MED FILL — HYDROCHLOROTHIAZIDE 25 MG T: 25 | 90 days supply | Qty: 45 | Fill #1

## 2018-02-13 ENCOUNTER — Other Ambulatory Visit: Payer: Self-pay | Admitting: Adult Health

## 2018-02-13 MED ORDER — AMPHETAMINE-DEXTROAMPHETAMINE 30 MG PO TABS: 1.0000 | ORAL_TABLET | Freq: Two times a day (BID) | ORAL | 0 refills | Status: DC

## 2018-02-13 MED ORDER — AMPHETAMINE-DEXTROAMPHETAMINE 10 MG PO TABS: ORAL_TABLET | ORAL | 0 refills | Status: DC

## 2018-02-13 MED FILL — AMPHETAMINE-DEXTROAMPHETAMI: 10 | 30 days supply | Qty: 30 | Fill #0

## 2018-02-13 MED FILL — AMPHETAMINE SALTS 30 MG TAB: 30 | 30 days supply | Qty: 60 | Fill #0

## 2018-02-23 NOTE — Progress Notes (Signed)
Subjective:    Patient ID: Michaela Bryant, female    DOB: June 16, 1975, 43 y.o.   MRN: [id number]  HPI"  [name] is here to establish as a new pt.  She is a pleasant 43 year old female.  PMH: Raynaud's Syndrome, GAD/depression, ADD, bil lower ext edema, and pinched vertebral nerve.  She was recently started on Bupropion 150 mg QD, approx 4 weeks ago- she reports only minor reduction in overall anxiety.  She has been titrated up/down on Adderall 10-80m BID for several years and was recently increased to 322mBID. She takes HCTX 12.31m11mD for bil lower extremity edema. She will f/u with established neurosurgeon to address increase in bil upper ext numbness She exercises 5-7 days a week; running, crossfit, yoga, barre. She follows heart healthy diet, abstains from tobacco and rarely consumes ETOH She is going through divorce and has two daughters, ages 20 3d 13 24e reports strong local support system and denies thoughts of harming herself/others  09/25/17 OV: [name] here for regular f/u: ADHD, GAD/Depression, and insomnia. She continues to exercise on a regular basis-  running, crossfit, yoga, barre. She reports medication compliance, denies SE, however feels that her concentration/focus worsens in afternoon. She continues to drink >75 oz water, follows a healthy diet, exercises regularly, abstains from tobacco use and rarely consumes ETOH She continues couple therapy with her husband-they are discussing reconcilliation She reports stable mood, denies thoughts of harming herself/others and has a strong support system of family/friends  12/25/17 OV: Ms. Michaela Medalesents for regular follow: ADHD and new onset of hair loss and fatigue the last few months. TSH level has not be drawn in years She estimates to drink 40-68 oz water/day, reports eating a heart healthy diet. She exercises 5-7 days week- crossift, yoga, pure barre, running She reports excellent focus and concentration on  Adderall 53m24mM, and will only need to use Aderrall 10mg62mafternoon 2-3 times/week She denies CP/dyspnea/dizziness/HA/palpitations/profuse sweating She denies tobacco/vape/excessive ETOH use She reports increased stress/anxiety- proceeding with divorce and dealing with the settlement issues. She reports strong support system  03/01/2018 OV: [name] for 3 month f/u: ADHD, depression/anxiety She reports stable mood, denies thoughts of harming herself/others She reports good focus/concentration on current Adderall regime, denies CP/dyspnea/dizziness/palpitations/insomnia Her ex-husband is making the divorce proceedings difficult but she reports a strong support system of local friends. She denies tobacco/vape/excessive ETOH use  Patient Care Team    Relationship Specialty Notifications Start End  DanfoMina MarbleP PCP - General Family Medicine  06/05/17   SternErline LevineConsulting Physician Neurosurgery  06/05/17   Mann,Juanita CraverConsulting Physician Gastroenterology  06/05/17   WhitwHarriett SineConsulting Physician Dermatology  06/05/17   CousiServando SalinaConsulting Physician Obstetrics and Gynecology  06/05/17   MartiStephannie Li OGlendivethalmology  06/05/17     Patient Active Problem List   Diagnosis Date Noted  . Fatigue 12/25/2017  . Hair loss 12/25/2017  . ADD (attention deficit disorder) 06/05/2017  . Healthcare maintenance 06/05/2017  . GAD (generalized anxiety disorder) 06/05/2017  . Pinched vertebral nerve 06/05/2017  . Sepsis (HCC) [place]03/2018     Past Medical History:  Diagnosis Date  . ADD (attention deficit disorder)   . Celiac disease   . Depression   . Pinched vertebral nerve      History reviewed. No pertinent surgical history.   Family History  Problem Relation Age of Onset  .  Alcohol abuse Mother   . Cancer Mother        breast  . Alcohol abuse Maternal Aunt   . Alcohol abuse Maternal Uncle   . Diabetes Maternal Grandmother    . Hypertension Maternal Grandmother   . Alcohol abuse Maternal Grandfather   . Hypertension Maternal Grandfather   . Stroke Paternal Grandfather      Social History   Substance and Sexual Activity  Drug Use No     Social History   Substance and Sexual Activity  Alcohol Use Yes  . Alcohol/week: 3.0 standard drinks  . Types: 3 Glasses of wine per week     Social History   Tobacco Use  Smoking Status Never Smoker  Smokeless Tobacco Never Used     Outpatient Encounter Medications as of 03/01/2018  Medication Sig  . acetaminophen (TYLENOL) 500 MG tablet Take 1,000 mg by mouth every 6 (six) hours as needed for mild pain or headache.  Marland Kitchen acidophilus (RISAQUAD) CAPS capsule Take 1 capsule by mouth daily.  Marland Kitchen amphetamine-dextroamphetamine (ADDERALL) 10 MG tablet Take at 2pm as needed for poor concentration/focus  . amphetamine-dextroamphetamine (ADDERALL) 30 MG tablet Take 1 tablet by mouth 2 (two) times daily.  . hydrochlorothiazide (MICROZIDE) 12.5 MG capsule TAKE 1 CAPSULE BY MOUTH DAILY IN THE MORNING AS NEEDED FOR SWELLING/FLUID  . ibuprofen (ADVIL,MOTRIN) 200 MG tablet Take 800 mg by mouth every 8 (eight) hours as needed for headache or mild pain.   . Multiple Vitamin (MULTIVITAMIN) capsule Take 1 capsule by mouth daily.  . NONFORMULARY OR COMPOUNDED ITEM [facility]:  Onychomycosis Nail Lacquer - fluconazole 2%, terbinafine 1%, DMSO, +Urea 20%, apply to affected areas daily.  . ondansetron (ZOFRAN-ODT) 8 MG disintegrating tablet Take 1 tablet (8 mg total) by mouth every 8 (eight) hours as needed for nausea or vomiting.  . traZODone (DESYREL) 50 MG tablet Take 0.5-1 tablets (25-50 mg total) by mouth at bedtime as needed for sleep.  . [DISCONTINUED] amphetamine-dextroamphetamine (ADDERALL) 10 MG tablet Take at 2pm as needed for poor concentration/focus  . [DISCONTINUED] amphetamine-dextroamphetamine (ADDERALL) 30 MG tablet Take 1 tablet by mouth 2 (two) times daily.  .  [DISCONTINUED] traZODone (DESYREL) 50 MG tablet Take 0.5-1 tablets (25-50 mg total) by mouth at bedtime as needed for sleep.   No facility-administered encounter medications on file as of 03/01/2018.     Allergies: Gluten meal  Body mass index is 20.81 kg/m.  Blood pressure 103/67, pulse 73, temperature 98.4 F (36.9 C), temperature source Oral, height 5' 4.25" (1.632 m), weight 122 lb 3.2 oz (55.4 kg), last menstrual period 02/08/2018, SpO2 98 %.  Review of Systems  Constitutional: Positive for fatigue. Negative for activity change, appetite change, chills, diaphoresis, fever and unexpected weight change.  Eyes: Negative for visual disturbance.  Respiratory: Negative for cough, chest tightness, shortness of breath, wheezing and stridor.   Cardiovascular: Negative for chest pain, palpitations and leg swelling.  Gastrointestinal: Negative for abdominal distention, abdominal pain, blood in stool, constipation, diarrhea, nausea and vomiting.  Endocrine: Negative for cold intolerance, heat intolerance, polydipsia, polyphagia and polyuria.  Genitourinary: Negative for difficulty urinating, flank pain, hematuria and urgency.  Musculoskeletal: Negative for arthralgias, back pain, gait problem, joint swelling, myalgias, neck pain and neck stiffness.  Skin: Negative for color change, pallor, rash and wound.  Neurological: Negative for dizziness and headaches.  Hematological: Does not bruise/bleed easily.  Psychiatric/Behavioral: Positive for decreased concentration. Negative for behavioral problems, confusion, dysphoric mood, hallucinations, self-injury, sleep disturbance and suicidal  ideas. The patient is nervous/anxious and is hyperactive.       Objective:   Physical Exam Vitals signs and nursing note reviewed.  Constitutional:      General: She is not in acute distress.    Appearance: She is well-developed. She is not diaphoretic.  HENT:     Head: Normocephalic.     Right Ear: External  ear normal.     Left Ear: External ear normal.     Nose: Nose normal.  Eyes:     Conjunctiva/sclera: Conjunctivae normal.     Pupils: Pupils are equal, round, and reactive to light.  Cardiovascular:     Rate and Rhythm: Normal rate and regular rhythm.     Heart sounds: Normal heart sounds. No murmur.  Pulmonary:     Effort: Pulmonary effort is normal. No respiratory distress.     Breath sounds: Normal breath sounds. No stridor. No wheezing or rales.  Chest:     Chest wall: No tenderness.  Skin:    General: Skin is warm and dry.     Capillary Refill: Capillary refill takes less than 2 seconds.     Coloration: Skin is not pale.     Findings: No erythema or rash.  Neurological:     Mental Status: She is alert and oriented to person, place, and time.  Psychiatric:        Behavior: Behavior normal.        Thought Content: Thought content normal.        Judgment: Judgment normal.       Assessment & Plan:   1. Attention deficit hyperactivity disorder (ADHD), unspecified ADHD type   2. GAD (generalized anxiety disorder)     ADD (attention deficit disorder) [place] Controlled Substance Database reviewed- no aberrancies noted Adderall 61m and 114mrefilled Continue all medications as directed. Increase water intake strive for at least 65 oz/day. Follow heart healthy diet and keep up your great level of activity. Follow-up 3 months.  GAD (generalized anxiety disorder) [facility]    FOLLOW-UP:  Return in about 3 months (around 05/30/2018) for Regular Follow Up, ADHD Medication.

## 2018-03-01 ENCOUNTER — Encounter: Payer: Self-pay | Admitting: Adult Health

## 2018-03-01 ENCOUNTER — Ambulatory Visit (INDEPENDENT_AMBULATORY_CARE_PROVIDER_SITE_OTHER): Payer: 59 | Admitting: Adult Health

## 2018-03-01 DIAGNOSIS — F909 Attention-deficit hyperactivity disorder, unspecified type: Secondary | ICD-10-CM | POA: Diagnosis not present

## 2018-03-01 DIAGNOSIS — F411 Generalized anxiety disorder: Secondary | ICD-10-CM

## 2018-03-01 MED ORDER — AMPHETAMINE-DEXTROAMPHETAMINE 10 MG PO TABS: ORAL_TABLET | ORAL | 0 refills | Status: DC

## 2018-03-01 MED ORDER — AMPHETAMINE-DEXTROAMPHETAMINE 30 MG PO TABS: 1.0000 | ORAL_TABLET | Freq: Two times a day (BID) | ORAL | 0 refills | Status: DC

## 2018-03-01 MED ORDER — TRAZODONE HCL 50 MG PO TABS: 25.0000 mg | ORAL_TABLET | Freq: Every evening | ORAL | 3 refills | Status: AC | PRN

## 2018-03-01 NOTE — Assessment & Plan Note (Signed)
Recommend Michaela Bryant at [facility] for therapy

## 2018-03-01 NOTE — Patient Instructions (Signed)

## 2018-03-01 NOTE — Assessment & Plan Note (Signed)
[place] Controlled Substance Database reviewed- no aberrancies noted Adderall 32m and 169mrefilled Continue all medications as directed. Increase water intake strive for at least 65 oz/day. Follow heart healthy diet and keep up your great level of activity. Follow-up 3 months.

## 2018-03-13 MED FILL — AMPHETAMINE-DEXTROAMPHETAMI: 10 | 30 days supply | Qty: 30 | Fill #0

## 2018-03-14 MED FILL — AMPHETAMINE-DEXTROAMPHETAMI: 30 | 30 days supply | Qty: 60 | Fill #0

## 2018-04-03 DIAGNOSIS — Z319 Encounter for procreative management, unspecified: Secondary | ICD-10-CM | POA: Diagnosis not present

## 2018-04-12 ENCOUNTER — Other Ambulatory Visit: Payer: Self-pay | Admitting: Adult Health

## 2018-04-12 MED ORDER — AMPHETAMINE-DEXTROAMPHETAMINE 10 MG PO TABS: ORAL_TABLET | ORAL | 0 refills | Status: DC

## 2018-04-12 MED ORDER — AMPHETAMINE-DEXTROAMPHETAMINE 30 MG PO TABS: 1.0000 | ORAL_TABLET | Freq: Two times a day (BID) | ORAL | 0 refills | Status: DC

## 2018-04-12 MED FILL — AMPHETAMINE-DEXTROAMPHETAMI: 10 | 30 days supply | Qty: 30 | Fill #0

## 2018-04-12 MED FILL — AMPHETAMINE-DEXTROAMPHETAMI: 30 | 30 days supply | Qty: 60 | Fill #0

## 2018-05-09 ENCOUNTER — Other Ambulatory Visit: Payer: Self-pay | Admitting: Adult Health

## 2018-05-09 MED ORDER — AMPHETAMINE-DEXTROAMPHETAMINE 30 MG PO TABS: 1.0000 | ORAL_TABLET | Freq: Two times a day (BID) | ORAL | 0 refills | Status: DC

## 2018-05-09 MED ORDER — AMPHETAMINE-DEXTROAMPHETAMINE 10 MG PO TABS: ORAL_TABLET | ORAL | 0 refills | Status: DC

## 2018-05-10 MED FILL — AMPHETAMINE-DEXTROAMPHETAMI: 10 | 30 days supply | Qty: 30 | Fill #0

## 2018-05-10 MED FILL — AMPHETAMINE-DEXTROAMPHETAMI: 30 | 30 days supply | Qty: 60 | Fill #0

## 2018-06-07 ENCOUNTER — Other Ambulatory Visit: Payer: Self-pay | Admitting: Adult Health

## 2018-06-07 MED ORDER — AMPHETAMINE-DEXTROAMPHETAMINE 10 MG PO TABS: ORAL_TABLET | ORAL | 0 refills | Status: DC

## 2018-06-07 MED ORDER — AMPHETAMINE-DEXTROAMPHETAMINE 30 MG PO TABS: 1.0000 | ORAL_TABLET | Freq: Two times a day (BID) | ORAL | 0 refills | Status: DC

## 2018-06-07 MED FILL — AMPHETAMINE-DEXTROAMPHETAMI: 10 | 30 days supply | Qty: 30 | Fill #0

## 2018-06-07 MED FILL — AMPHETAMINE-DEXTROAMPHETAMI: 30 | 30 days supply | Qty: 60 | Fill #0

## 2018-06-07 NOTE — Progress Notes (Signed)
Virtual Visit via Telephone Note  I connected with Michaela Bryant on 06/11/2018 at  9:45 AM EDT by telephone and verified that I am speaking with the correct person using two identifiers.  Location: Patient:Home Provider: In Clinic   I discussed the limitations, risks, security and privacy concerns of performing an evaluation and management service by telephone and the availability of in person appointments. I also discussed with the patient that there may be a patient responsible charge related to this service. The patient expressed understanding and agreed to proceed.  History of Present Illness: 03/01/2018 OV: [name] presents for 3 month f/u: ADHD, depression/anxiety She reports stable mood, denies thoughts of harming herself/others She reports good focus/concentration on current Adderall regime, denies CP/dyspnea/dizziness/palpitations/insomnia Her ex-husband is making the divorce proceedings difficult but she reports a strong support system of local friends. She denies tobacco/vape/excessive ETOH use 06/07/2018 OV: [name] calls in today for 3 month f/u: ADHD, depression/anxiety She reports stable mood, denies thoughts of harming herself/others Current Adderall regime: Adderall 38m BID, Adderall 170mPRN 1400- she estimates to use 3-4 times/week She denies CP/dsypnea/dizziness/HA/palpitations/insomnia She continues to drink plenty of water, follows heart healthy diet, and exercise on regular basis- running, Crossfit, Yoga  Patient Care Team    Relationship Specialty Notifications Start End  DaMina Marble, NP PCP - General Family Medicine  06/05/17   StErline LevineMD Consulting Physician Neurosurgery  06/05/17   MaJuanita CraverMD Consulting Physician Gastroenterology  06/05/17   WhHarriett SineMD Consulting Physician Dermatology  06/05/17   CoServando SalinaMD Consulting Physician Obstetrics and Gynecology  06/05/17   MaStephannie LiODGreen SpringOphthalmology  06/05/17      Patient Active Problem List   Diagnosis Date Noted  . Fatigue 12/25/2017  . Hair loss 12/25/2017  . ADD (attention deficit disorder) 06/05/2017  . Healthcare maintenance 06/05/2017  . GAD (generalized anxiety disorder) 06/05/2017  . Pinched vertebral nerve 06/05/2017  . Sepsis ([facility]03/2016     Past Medical History:  Diagnosis Date  . ADD (attention deficit disorder)   . Celiac disease   . Depression   . Pinched vertebral nerve      History reviewed. No pertinent surgical history.   Family History  Problem Relation Age of Onset  . Alcohol abuse Mother   . Cancer Mother        breast  . Alcohol abuse Maternal Aunt   . Alcohol abuse Maternal Uncle   . Diabetes Maternal Grandmother   . Hypertension Maternal Grandmother   . Alcohol abuse Maternal Grandfather   . Hypertension Maternal Grandfather   . Stroke Paternal Grandfather      Social History   Substance and Sexual Activity  Drug Use No     Social History   Substance and Sexual Activity  Alcohol Use Yes  . Alcohol/week: 3.0 standard drinks  . Types: 3 Glasses of wine per week     Social History   Tobacco Use  Smoking Status Never Smoker  Smokeless Tobacco Never Used     Outpatient Encounter Medications as of 06/11/2018  Medication Sig  . acetaminophen (TYLENOL) 500 MG tablet Take 1,000 mg by mouth every 6 (six) hours as needed for mild pain or headache.  . amphetamine-dextroamphetamine (ADDERALL) 10 MG tablet Take at 2pm as needed for poor concentration/focus  . amphetamine-dextroamphetamine (ADDERALL) 30 MG tablet Take 1 tablet by mouth 2 (two) times daily.  . hydrochlorothiazide (MICROZIDE) 12.5 MG capsule TAKE 1 CAPSULE  BY MOUTH DAILY IN THE MORNING AS NEEDED FOR SWELLING/FLUID  . ibuprofen (ADVIL,MOTRIN) 200 MG tablet Take 800 mg by mouth every 8 (eight) hours as needed for headache or mild pain.   . Multiple Vitamin (MULTIVITAMIN) capsule Take 1 capsule by mouth daily.  . ondansetron  (ZOFRAN-ODT) 8 MG disintegrating tablet Take 1 tablet (8 mg total) by mouth every 8 (eight) hours as needed for nausea or vomiting.  . traZODone (DESYREL) 50 MG tablet Take 0.5-1 tablets (25-50 mg total) by mouth at bedtime as needed for sleep.  . [DISCONTINUED] acidophilus (RISAQUAD) CAPS capsule Take 1 capsule by mouth daily.  . [DISCONTINUED] NONFORMULARY OR COMPOUNDED ITEM [facility]:  Onychomycosis Nail Lacquer - fluconazole 2%, terbinafine 1%, DMSO, +Urea 20%, apply to affected areas daily.   No facility-administered encounter medications on file as of 06/11/2018.     Allergies: Gluten meal  Body mass index is 19.76 kg/m.  Temperature 98.1 F (36.7 C), temperature source Oral, height 5' 4.25" (1.632 m), weight 116 lb (52.6 kg), last menstrual period 06/07/2018. Review of Systems: General:   Denies fever, chills, unexplained weight loss.  Optho/Auditory:   Denies visual changes, blurred vision/LOV Respiratory:   Denies SOB, DOE more than baseline levels.  Cardiovascular:   Denies chest pain, palpitations, new onset peripheral edema  Gastrointestinal:   Denies nausea, vomiting, diarrhea.  Genitourinary: Denies dysuria, freq/ urgency, flank pain or discharge from genitals.  Endocrine:     Denies hot or cold intolerance, polyuria, polydipsia. Musculoskeletal:   Denies unexplained myalgias, joint swelling, unexplained arthralgias, gait problems.  Skin:  Denies rash, suspicious lesions Neurological:     Denies dizziness, unexplained weakness, numbness  Psychiatric/Behavioral:   Denies mood changes, suicidal or homicidal ideations, hallucinations This patient does not have sx concerning for COVID-19 Infection (ie; fever, chills, cough, new or worsening shortness of breath).  Observations/Objective: No acute distress noted during the telephone conversation  Assessment and Plan: Adderall 66m BID, 166mPRN at 1400 Remain well hydrated, follow Mediterranean diet Continue regular  exercise  Follow Up Instructions: 3 months CPE , fasting labs the week prior  I discussed the assessment and treatment plan with the patient. The patient was provided an opportunity to ask questions and all were answered. The patient agreed with the plan and demonstrated an understanding of the instructions.   The patient was advised to call back or seek an in-person evaluation if the symptoms worsen or if the condition fails to improve as anticipated.  I provided 18 minutes of non-face-to-face time during this encounter.   KaEsaw GrandchildNP

## 2018-06-11 ENCOUNTER — Other Ambulatory Visit: Payer: Self-pay

## 2018-06-11 ENCOUNTER — Ambulatory Visit (INDEPENDENT_AMBULATORY_CARE_PROVIDER_SITE_OTHER): Payer: 59 | Admitting: Adult Health

## 2018-06-11 ENCOUNTER — Encounter: Payer: Self-pay | Admitting: Adult Health

## 2018-06-11 VITALS — Temp 98.1°F | Ht 64.25 in | Wt 116.0 lb

## 2018-06-11 DIAGNOSIS — F909 Attention-deficit hyperactivity disorder, unspecified type: Secondary | ICD-10-CM | POA: Diagnosis not present

## 2018-06-11 DIAGNOSIS — Z0000 Encounter for general adult medical examination without abnormal findings: Secondary | ICD-10-CM | POA: Diagnosis not present

## 2018-06-11 NOTE — Assessment & Plan Note (Signed)
[place] Controlled Substance Database reviewed- no aberrancies noted Adderall 60m BID, Adderall 138mPRN- 3-4 times/week She denies SE F/u 3 months

## 2018-06-11 NOTE — Assessment & Plan Note (Signed)
Assessment and Plan: Adderall 52m BID, 11mPRN at 1400 Remain well hydrated, follow Mediterranean diet Continue regular exercise  Follow Up Instructions: 3 months CPE , fasting labs the week prior  I discussed the assessment and treatment plan with the patient. The patient was provided an opportunity to ask questions and all were answered. The patient agreed with the plan and demonstrated an understanding of the instructions.   The patient was advised to call back or seek an in-person evaluation if the symptom

## 2018-07-04 ENCOUNTER — Other Ambulatory Visit: Payer: Self-pay | Admitting: Adult Health

## 2018-07-04 MED ORDER — AMPHETAMINE-DEXTROAMPHETAMINE 10 MG PO TABS: ORAL_TABLET | ORAL | 0 refills | Status: DC

## 2018-07-04 MED ORDER — AMPHETAMINE-DEXTROAMPHETAMINE 30 MG PO TABS: 1.0000 | ORAL_TABLET | Freq: Two times a day (BID) | ORAL | 0 refills | Status: DC

## 2018-07-05 MED FILL — AMPHETAMINE-DEXTROAMPHETAMI: 30 | 30 days supply | Qty: 60 | Fill #0

## 2018-07-05 MED FILL — AMPHETAMINE-DEXTROAMPHETAMI: 10 | 30 days supply | Qty: 30 | Fill #0

## 2018-08-01 ENCOUNTER — Other Ambulatory Visit: Payer: Self-pay | Admitting: Adult Health

## 2018-08-01 MED ORDER — AMPHETAMINE-DEXTROAMPHETAMINE 10 MG PO TABS: ORAL_TABLET | ORAL | 0 refills | Status: DC

## 2018-08-01 MED ORDER — AMPHETAMINE-DEXTROAMPHETAMINE 30 MG PO TABS: 1.0000 | ORAL_TABLET | Freq: Two times a day (BID) | ORAL | 0 refills | Status: DC

## 2018-08-02 ENCOUNTER — Encounter: Payer: Self-pay | Admitting: Adult Health

## 2018-08-02 MED FILL — AMPHETAMINE-DEXTROAMPHETAMI: 30 | 30 days supply | Qty: 60 | Fill #0

## 2018-08-02 MED FILL — AMPHETAMINE-DEXTROAMPHETAMI: 10 | 30 days supply | Qty: 30 | Fill #0

## 2018-08-30 ENCOUNTER — Other Ambulatory Visit: Payer: Self-pay | Admitting: Adult Health

## 2018-08-30 MED ORDER — AMPHETAMINE-DEXTROAMPHETAMINE 30 MG PO TABS: 1.0000 | ORAL_TABLET | Freq: Two times a day (BID) | ORAL | 0 refills | Status: DC

## 2018-08-30 MED ORDER — AMPHETAMINE-DEXTROAMPHETAMINE 10 MG PO TABS: ORAL_TABLET | ORAL | 0 refills | Status: DC

## 2018-08-30 MED FILL — AMPHETAMINE-DEXTROAMPHETAMI: 10 | 30 days supply | Qty: 30 | Fill #0

## 2018-08-30 MED FILL — AMPHETAMINE-DEXTROAMPHETAMI: 30 | 30 days supply | Qty: 60 | Fill #0

## 2018-09-03 ENCOUNTER — Ambulatory Visit: Payer: 59 | Admitting: Adult Health

## 2018-09-05 DIAGNOSIS — H52223 Regular astigmatism, bilateral: Secondary | ICD-10-CM | POA: Diagnosis not present

## 2018-09-05 DIAGNOSIS — H524 Presbyopia: Secondary | ICD-10-CM | POA: Diagnosis not present

## 2018-09-05 DIAGNOSIS — H5213 Myopia, bilateral: Secondary | ICD-10-CM | POA: Diagnosis not present

## 2018-09-18 ENCOUNTER — Other Ambulatory Visit: Payer: Self-pay

## 2018-09-18 ENCOUNTER — Ambulatory Visit (INDEPENDENT_AMBULATORY_CARE_PROVIDER_SITE_OTHER): Payer: 59 | Admitting: Adult Health

## 2018-09-18 ENCOUNTER — Encounter: Payer: Self-pay | Admitting: Adult Health

## 2018-09-18 VITALS — BP 118/76 | HR 69 | Temp 98.7°F | Ht 64.25 in | Wt 116.9 lb

## 2018-09-18 DIAGNOSIS — L659 Nonscarring hair loss, unspecified: Secondary | ICD-10-CM

## 2018-09-18 DIAGNOSIS — Z0000 Encounter for general adult medical examination without abnormal findings: Secondary | ICD-10-CM

## 2018-09-18 DIAGNOSIS — F909 Attention-deficit hyperactivity disorder, unspecified type: Secondary | ICD-10-CM | POA: Diagnosis not present

## 2018-09-18 DIAGNOSIS — R5383 Other fatigue: Secondary | ICD-10-CM

## 2018-09-18 NOTE — Assessment & Plan Note (Signed)
None at present

## 2018-09-18 NOTE — Addendum Note (Signed)
Addended by: Fonnie Mu on: 09/18/2018 10:22 AM   Modules accepted: Orders

## 2018-09-18 NOTE — Assessment & Plan Note (Addendum)
[place] Controlled Substance Database reviewed- refills appropriate  Sx's well controlled on Adderall 95m BID, 110m 1400 PRN

## 2018-09-18 NOTE — Assessment & Plan Note (Signed)
Continue all medications as directed. Remain well hydrated, follow Mediterranean diet. Continue regular exercise. Please schedule fasting lab appt in 2 weeks, complete physical in 3 months. Continue to social distance and wear a mask when in public.

## 2018-09-18 NOTE — Progress Notes (Signed)
Subjective:    Patient ID: Michaela Bryant, female    DOB: 1975-07-31, 43 y.o.   MRN: [id number]  HPI: 03/01/2018 OV: [name] presents for 3 month f/u: ADHD, depression/anxiety She reports stable mood, denies thoughts of harming herself/others She reports good focus/concentration on current Adderall regime, denies CP/dyspnea/dizziness/palpitations/insomnia Her ex-husband is making the divorce proceedings difficult but she reports a strong support system of local friends. She denies tobacco/vape/excessive ETOH use 06/07/2018 OV: [name] calls in today for 3 month f/u: ADHD, depression/anxiety She reports stable mood, denies thoughts of harming herself/others Current Adderall regime: Adderall 59m BID, Adderall 116mPRN 1400- she estimates to use 3-4 times/week She denies CP/dsypnea/dizziness/HA/palpitations/insomnia She continues to drink plenty of water, follows heart healthy diet, and exercise on regular basis- running, Crossfit, Yoga 09/18/2018 OV: [name] in for 3 month f/u: ADHD, Michaela Bryant Substance Database reviewed- refills appropriate  Sx's well controlled on Adderall 3087mID, 70m55m400 PRN  She denies CP/dyspnea/dizziness/HA/palpitations She reports stable mood, denies SI/HI She has been walking >4 miles/day and performing home barre workouts. Wt is stable- 116 Body mass index is 19.91 kg/m.  She estimates to drink >70 oz water/day, follow heart healthy diet She continues to abstain from tobacco/vape/raraly drinks ETOH She reports feeling "quite well in general". Patient Care Team    Relationship Specialty Notifications Start End  DanfColumbiatyValetta FullerNP PCP - General Family Medicine  06/05/17   SterErline Levine Consulting Physician Neurosurgery  06/05/17   MannJuanita Craver Consulting Physician Gastroenterology  06/05/17   WhitHarriett Sine Consulting Physician Dermatology  06/05/17   CousServando Salina Consulting Physician  Obstetrics and Gynecology  06/05/17   MartStephannie Li  [facility]  06/05/17     Patient Active Problem List   Diagnosis Date Noted  . Fatigue 12/25/2017  . Hair loss 12/25/2017  . ADD (attention deficit disorder) 06/05/2017  . Healthcare maintenance 06/05/2017  . GAD (generalized anxiety disorder) 06/05/2017  . Pinched vertebral nerve 06/05/2017  . Sepsis (HCC)[place]/03/2016     Past Medical History:  Diagnosis Date  . ADD (attention deficit disorder)   . Celiac disease   . Depression   . Pinched vertebral nerve      No past surgical history on file.   Family History  Problem Relation Age of Onset  . Alcohol abuse Mother   . Cancer Mother        breast  . Alcohol abuse Maternal Aunt   . Alcohol abuse Maternal Uncle   . Diabetes Maternal Grandmother   . Hypertension Maternal Grandmother   . Alcohol abuse Maternal Grandfather   . Hypertension Maternal Grandfather   . Stroke Paternal Grandfather      Social History   Substance and Sexual Activity  Drug Use No     Social History   Substance and Sexual Activity  Alcohol Use Yes  . Alcohol/week: 3.0 standard drinks  . Types: 3 Glasses of wine per week     Social History   Tobacco Use  Smoking Status Never Smoker  Smokeless Tobacco Never Used     Outpatient Encounter Medications as of 09/18/2018  Medication Sig  . acetaminophen (TYLENOL) 500 MG tablet Take 1,000 mg by mouth every 6 (six) hours as needed for mild pain or headache.  . amphetamine-dextroamphetamine (ADDERALL) 10 MG tablet Take at 2pm as needed for poor concentration/focus  . amphetamine-dextroamphetamine (ADDERALL) 30 MG tablet Take 1 tablet by  mouth 2 (two) times daily.  . hydrochlorothiazide (MICROZIDE) 12.5 MG capsule TAKE 1 CAPSULE BY MOUTH DAILY IN THE MORNING AS NEEDED FOR SWELLING/FLUID  . ibuprofen (ADVIL,MOTRIN) 200 MG tablet Take 800 mg by mouth every 8 (eight) hours as needed for headache or mild pain.   . Multiple Vitamin  (MULTIVITAMIN) capsule Take 1 capsule by mouth daily.  . ondansetron (ZOFRAN-ODT) 8 MG disintegrating tablet Take 1 tablet (8 mg total) by mouth every 8 (eight) hours as needed for nausea or vomiting.  . traZODone (DESYREL) 50 MG tablet Take 0.5-1 tablets (25-50 mg total) by mouth at bedtime as needed for sleep.   No facility-administered encounter medications on file as of 09/18/2018.     Allergies: Gluten meal  Body mass index is 19.91 kg/m.  Blood pressure 118/76, pulse 69, temperature 98.7 F (37.1 C), temperature source Oral, height 5' 4.25" (1.632 m), weight 116 lb 14.4 oz (53 kg), SpO2 99 %.   Review of Systems  Constitutional: Negative for activity change, appetite change, chills, diaphoresis, fatigue, fever and unexpected weight change.  HENT: Negative for congestion.   Eyes: Negative for visual disturbance.  Respiratory: Negative for cough, choking, chest tightness, shortness of breath, wheezing and stridor.   Cardiovascular: Negative for chest pain, palpitations and leg swelling.  Gastrointestinal: Negative for abdominal distention, abdominal pain, anal bleeding, constipation, diarrhea, nausea and vomiting.  Endocrine: Negative for cold intolerance, heat intolerance, polydipsia, polyphagia and polyuria.  Genitourinary: Negative for difficulty urinating and flank pain.  Musculoskeletal: Negative for arthralgias, back pain, gait problem, joint swelling, myalgias, neck pain and neck stiffness.  Skin: Negative for color change, pallor, rash and wound.  Neurological: Negative for dizziness, weakness and headaches.  Hematological: Negative for adenopathy. Does not bruise/bleed easily.  Psychiatric/Behavioral: Negative for agitation, behavioral problems, confusion, decreased concentration, dysphoric mood, hallucinations, self-injury, sleep disturbance and suicidal ideas. The patient is not nervous/anxious and is not hyperactive.        Objective:   Physical Exam Vitals signs  and nursing note reviewed.  Constitutional:      General: She is not in acute distress.    Appearance: She is normal weight. She is not ill-appearing, toxic-appearing or diaphoretic.  HENT:     Head: Normocephalic and atraumatic.  Cardiovascular:     Rate and Rhythm: Normal rate and regular rhythm.     Pulses: Normal pulses.     Heart sounds: Normal heart sounds. No murmur. No friction rub. No gallop.   Pulmonary:     Effort: Pulmonary effort is normal. No respiratory distress.     Breath sounds: Normal breath sounds. No stridor. No wheezing, rhonchi or rales.  Chest:     Chest wall: No tenderness.  Skin:    Capillary Refill: Capillary refill takes less than 2 seconds.  Neurological:     Mental Status: She is alert.  Psychiatric:        Mood and Affect: Mood normal.        Thought Content: Thought content normal.        Judgment: Judgment normal.       Assessment & Plan:   1. Attention deficit hyperactivity disorder (ADHD), unspecified ADHD type   2. Healthcare maintenance   3. Fatigue, unspecified type     ADD (attention deficit disorder) [place] Controlled Substance Database reviewed- refills appropriate  Sx's well controlled on Adderall 8m BID, 177m 1400 PRN    Healthcare maintenance Continue all medications as directed. Remain well hydrated, follow Mediterranean  diet. Continue regular exercise. Please schedule fasting lab appt in 2 weeks, complete physical in 3 months. Continue to social distance and wear a mask when in public.  Fatigue None at present     FOLLOW-UP:  Return in about 3 months (around 12/18/2018) for CPE.

## 2018-09-18 NOTE — Patient Instructions (Addendum)
Attention Deficit Hyperactivity Disorder, Adult Attention deficit hyperactivity disorder (ADHD) is a mental health disorder that starts during childhood. For many people with ADHD, the disorder continues into adult years. There are many things that you and your health care provider or therapist (mental health professional) can do to manage your symptoms. What are the causes? The exact cause of ADHD is not known. What increases the risk? You are more likely to develop this condition if:  You have a family history of ADHD.  You are female.  You were born to a mother who smoked or drank alcohol during pregnancy.  You were exposed to lead poisoning or other toxins in the womb or in early life.  You were born before 67 weeks of pregnancy (prematurely) or you had a low birth weight.  You have experienced a brain injury. What are the signs or symptoms? Symptoms of this condition depend on the type of ADHD. The two main types are inattentive and hyperactive-impulsive. Some people may have symptoms of both types. Symptoms of the inattentive type include:  Difficulty watching, listening, or thinking with focused effort (paying attention).  Making careless mistakes.  Not listening.  Not following instructions.  Being disorganized.  Avoiding tasks that require time and attention.  Losing things.  Forgetting things.  Being easily distracted. Symptoms of the hyperactive-impulsive type include:  Restlessness.  Talking too much.  Interrupting.  Difficulty with: ? Sitting still. ? Staying quiet. ? Feeling motivated. ? Relaxing. ? Waiting in line or waiting for a turn. How is this diagnosed? This condition is diagnosed based on your current symptoms and your history of symptoms. The diagnosis can be made by a provider such as a primary care provider, psychiatrist, psychologist, or clinical social worker. The provider may use a symptom checklist or a standardized behavior rating  scale to evaluate your symptoms. He or she may want to talk with family members who have known you for a long time and have observed your behaviors. There are no lab tests or brain imaging tests that can diagnose ADHD. How is this treated? This condition can be treated with medicines and behavior therapy. Medicines may be the best option to reduce impulsive behaviors and improve attention. Your health care provider may recommend:  Stimulant medicines. These are the most common medicines used for adult ADHD. They affect certain chemicals in the brain (neurotransmitters). These medicines may be long-acting or short-acting. This will determine how often you need to take the medicine.  A non-stimulant medicine for adult ADHD (atomoxetine). This medicine increases a neurotransmitter called norepinephrine. It may take weeks to months to see effects from this medicine. Psychotherapy and behavioral management are also important for treating ADHD. Psychotherapy is often used along with medicine. Your health care provider may suggest:  Cognitive behavioral therapy (CBT). This type of therapy teaches you to replace negative thoughts and actions with positive thoughts and actions. When used as part of ADHD treatment, this therapy may also include: ? Coping strategies for organization, time management, impulse control, and stress reduction. ? Mindfulness and meditation training.  Behavioral management. This may include strategies for organization and time management. You may work with an ADHD coach who is specially trained to help people with ADHD to manage and organize activities and to function more effectively. Follow these instructions at home: Medicines   Take over-the-counter and prescription medicines only as told by your health care provider.  Talk with your health care provider about the possible side effects of your  medicine to watch for. General instructions   Learn as much as you can about  adult ADHD, and work closely with your health care providers to find the treatments that work best for you.  Do not use drugs or abuse alcohol. Limit alcohol intake to no more than 1 drink a day for nonpregnant women and 2 drinks a day for men. One drink equals 12 oz of beer, 5 oz of wine, or 1 oz of hard liquor.  Follow the same schedule each day. Make sure your schedule includes enough time for you to get plenty of sleep.  Use reminder devices like notes, calendars, and phone apps to stay on-time and organized.  Eat a healthy diet. Do not skip meals.  Exercise regularly. Exercise can help to reduce stress and anxiety.  Keep all follow-up visits as told by your health care provider and therapist. This is important. Where to find more information  A health care provider may be able to recommend resources that are available online or over the phone. You could start with: ? Attention Deficit Disorder Association (ADDA): PubAddiction.co.nz ? [facility]): https://carter.com/ Contact a health care provider if:  Your symptoms are changing, getting worse, or not improving.  You have side effects from your medicine, such as: ? Repeated muscle twitches, coughing, or speech outbursts. ? Sleep problems. ? Loss of appetite. ? Depression. ? New or worsening behavior problems. ? Dizziness. ? Unusually fast heartbeat. ? Stomach pains. ? Headaches.  You are struggling with anxiety, depression, or substance abuse. Get help right away if:  You have a severe reaction to a medicine.  You have thoughts of hurting yourself or others. If you ever feel like you may hurt yourself or others, or have thoughts about taking your own life, get help right away. You can go to the nearest emergency department or call:  Your local emergency services (911 in the [place]  A suicide crisis helpline, such as the [place] at (705) 344-0097. This is open 24 hours a  day. Summary  ADHD is a mental health disorder that starts during childhood and often continues into adult years.  The exact cause of ADHD is not known.  There is no cure for ADHD, but treatment with medicine, therapy, or behavioral training can help you manage your condition. This information is not intended to replace advice given to you by your health care provider. Make sure you discuss any questions you have with your health care provider. Document Released: 08/11/2016 Document Revised: 12/02/2016 Document Reviewed: 08/11/2016 Elsevier Patient Education  2020 [place] all medications as directed. Remain well hydrated, follow Mediterranean diet. Continue regular exercise. Please schedule fasting lab appt in 2 weeks, complete physical in 3 months. Continue to social distance and wear a mask when in public. GREAT TO SEE YOU!

## 2018-09-26 ENCOUNTER — Other Ambulatory Visit: Payer: Self-pay | Admitting: Adult Health

## 2018-09-26 ENCOUNTER — Encounter: Payer: Self-pay | Admitting: Adult Health

## 2018-09-27 MED FILL — AMPHETAMINE-DEXTROAMPHETAMI: 30 | 30 days supply | Qty: 60 | Fill #0

## 2018-10-05 ENCOUNTER — Other Ambulatory Visit: Payer: Self-pay | Admitting: Adult Health

## 2018-10-09 MED FILL — AMPHETAMINE-DEXTROAMPHETAMI: 10 | 30 days supply | Qty: 30 | Fill #0

## 2018-10-25 ENCOUNTER — Other Ambulatory Visit: Payer: Self-pay | Admitting: Adult Health

## 2018-10-25 MED ORDER — AMPHETAMINE-DEXTROAMPHETAMINE 30 MG PO TABS: 1.0000 | ORAL_TABLET | Freq: Two times a day (BID) | ORAL | 0 refills | Status: DC

## 2018-10-25 MED FILL — AMPHETAMINE-DEXTROAMPHETAMI: 30 | 30 days supply | Qty: 60 | Fill #0

## 2018-11-07 ENCOUNTER — Other Ambulatory Visit: Payer: Self-pay | Admitting: Adult Health

## 2018-11-07 MED FILL — AMPHETAMINE-DEXTROAMPHETAMI: 10 | 30 days supply | Qty: 30 | Fill #0

## 2018-11-12 DIAGNOSIS — R519 Headache, unspecified: Secondary | ICD-10-CM | POA: Diagnosis not present

## 2018-11-12 DIAGNOSIS — Z7189 Other specified counseling: Secondary | ICD-10-CM | POA: Diagnosis not present

## 2018-11-12 DIAGNOSIS — R5383 Other fatigue: Secondary | ICD-10-CM | POA: Diagnosis not present

## 2018-11-12 DIAGNOSIS — Z20828 Contact with and (suspected) exposure to other viral communicable diseases: Secondary | ICD-10-CM | POA: Diagnosis not present

## 2018-11-26 ENCOUNTER — Other Ambulatory Visit: Payer: Self-pay | Admitting: Adult Health

## 2018-11-26 MED ORDER — AMPHETAMINE-DEXTROAMPHETAMINE 30 MG PO TABS: 1.0000 | ORAL_TABLET | Freq: Two times a day (BID) | ORAL | 0 refills | Status: DC

## 2018-11-26 NOTE — Progress Notes (Signed)
PDMP reviewed- okay to refill Adderall 24m BID

## 2018-12-06 ENCOUNTER — Other Ambulatory Visit (HOSPITAL_BASED_OUTPATIENT_CLINIC_OR_DEPARTMENT_OTHER): Payer: Self-pay | Admitting: Adult Health

## 2018-12-06 ENCOUNTER — Other Ambulatory Visit: Payer: Self-pay | Admitting: Adult Health

## 2018-12-06 DIAGNOSIS — Z1231 Encounter for screening mammogram for malignant neoplasm of breast: Secondary | ICD-10-CM

## 2018-12-06 MED ORDER — AMPHETAMINE-DEXTROAMPHETAMINE 10 MG PO TABS: ORAL_TABLET | ORAL | 0 refills | Status: DC

## 2018-12-06 MED FILL — AMPHETAMINE-DEXTROAMPHETAMI: 10 | 30 days supply | Qty: 30 | Fill #0

## 2018-12-07 ENCOUNTER — Ambulatory Visit (HOSPITAL_BASED_OUTPATIENT_CLINIC_OR_DEPARTMENT_OTHER)
Admission: RE | Admit: 2018-12-07 | Discharge: 2018-12-07 | Disposition: A | Discharge status: Home / Self Care | Payer: 59 | Source: Ambulatory Visit | Attending: Adult Health | Admitting: Adult Health

## 2018-12-07 ENCOUNTER — Other Ambulatory Visit: Payer: Self-pay

## 2018-12-07 DIAGNOSIS — Z1231 Encounter for screening mammogram for malignant neoplasm of breast: Secondary | ICD-10-CM | POA: Diagnosis not present

## 2018-12-10 ENCOUNTER — Other Ambulatory Visit: Payer: Self-pay | Admitting: Adult Health

## 2018-12-10 DIAGNOSIS — R928 Other abnormal and inconclusive findings on diagnostic imaging of breast: Secondary | ICD-10-CM

## 2018-12-12 ENCOUNTER — Other Ambulatory Visit: Payer: Self-pay

## 2018-12-12 ENCOUNTER — Ambulatory Visit
Admission: RE | Admit: 2018-12-12 | Discharge: 2018-12-12 | Disposition: A | Discharge status: Home / Self Care | Payer: 59 | Source: Ambulatory Visit | Attending: Adult Health | Admitting: Adult Health

## 2018-12-12 DIAGNOSIS — R928 Other abnormal and inconclusive findings on diagnostic imaging of breast: Secondary | ICD-10-CM

## 2018-12-12 DIAGNOSIS — R922 Inconclusive mammogram: Secondary | ICD-10-CM | POA: Diagnosis not present

## 2018-12-12 DIAGNOSIS — N6489 Other specified disorders of breast: Secondary | ICD-10-CM | POA: Diagnosis not present

## 2018-12-25 ENCOUNTER — Other Ambulatory Visit: Payer: Self-pay | Admitting: Adult Health

## 2018-12-25 MED ORDER — AMPHETAMINE-DEXTROAMPHETAMINE 30 MG PO TABS: 1.0000 | ORAL_TABLET | Freq: Two times a day (BID) | ORAL | 0 refills | Status: DC

## 2018-12-25 MED FILL — AMPHETAMINE-DEXTROAMPHETAMI: 30 | 30 days supply | Qty: 60 | Fill #0

## 2018-12-26 ENCOUNTER — Other Ambulatory Visit: Payer: 59

## 2018-12-26 ENCOUNTER — Other Ambulatory Visit: Payer: Self-pay

## 2018-12-26 DIAGNOSIS — Z0000 Encounter for general adult medical examination without abnormal findings: Secondary | ICD-10-CM | POA: Diagnosis not present

## 2018-12-26 DIAGNOSIS — R7989 Other specified abnormal findings of blood chemistry: Secondary | ICD-10-CM | POA: Diagnosis not present

## 2018-12-26 DIAGNOSIS — L659 Nonscarring hair loss, unspecified: Secondary | ICD-10-CM | POA: Diagnosis not present

## 2018-12-26 DIAGNOSIS — R5383 Other fatigue: Secondary | ICD-10-CM | POA: Diagnosis not present

## 2018-12-27 LAB — COMPREHENSIVE METABOLIC PANEL
ALT: 29 IU/L (ref 0–32)
AST: 66 IU/L — ABNORMAL HIGH (ref 0–40)
Albumin/Globulin Ratio: 1.5 (ref 1.2–2.2)
Albumin: 4.1 g/dL (ref 3.8–4.8)
Alkaline Phosphatase: 93 IU/L (ref 39–117)
BUN/Creatinine Ratio: 13 (ref 9–23)
BUN: 11 mg/dL (ref 6–24)
Bilirubin Total: 0.2 mg/dL (ref 0.0–1.2)
CO2: 23 mmol/L (ref 20–29)
Calcium: 9.1 mg/dL (ref 8.7–10.2)
Chloride: 103 mmol/L (ref 96–106)
Creatinine, Ser: 0.82 mg/dL (ref 0.57–1.00)
GFR calc Af Amer: 100 mL/min/{1.73_m2} (ref >59)
GFR calc non Af Amer: 87 mL/min/{1.73_m2} (ref >59)
Globulin, Total: 2.7 g/dL (ref 1.5–4.5)
Glucose: 84 mg/dL (ref 65–99)
Potassium: 3.8 mmol/L (ref 3.5–5.2)
Sodium: 138 mmol/L (ref 134–144)
Total Protein: 6.8 g/dL (ref 6.0–8.5)

## 2018-12-27 LAB — CBC WITH DIFFERENTIAL/PLATELET
Basophils Absolute: 0.1 10*3/uL (ref 0.0–0.2)
Basos: 1 %
EOS (ABSOLUTE): 0 10*3/uL (ref 0.0–0.4)
Eos: 0 %
Hematocrit: 36.5 % (ref 34.0–46.6)
Hemoglobin: 11.9 g/dL (ref 11.1–15.9)
Immature Grans (Abs): 0.1 10*3/uL (ref 0.0–0.1)
Immature Granulocytes: 1 %
Lymphocytes Absolute: 1.5 10*3/uL (ref 0.7–3.1)
Lymphs: 20 %
MCH: 25.6 pg — ABNORMAL LOW (ref 26.6–33.0)
MCHC: 32.6 g/dL (ref 31.5–35.7)
MCV: 79 fL (ref 79–97)
Monocytes Absolute: 0.5 10*3/uL (ref 0.1–0.9)
Monocytes: 7 %
Neutrophils Absolute: 5.4 10*3/uL (ref 1.4–7.0)
Neutrophils: 71 %
Platelets: 386 10*3/uL (ref 150–450)
RBC: 4.64 x10E6/uL (ref 3.77–5.28)
RDW: 15 % (ref 11.7–15.4)
WBC: 7.6 10*3/uL (ref 3.4–10.8)

## 2018-12-27 LAB — LIPID PANEL
Chol/HDL Ratio: 2.5 ratio (ref 0.0–4.4)
Cholesterol, Total: 151 mg/dL (ref 100–199)
HDL: 60 mg/dL (ref >39)
LDL Chol Calc (NIH): 79 mg/dL (ref 0–99)
Triglycerides: 55 mg/dL (ref 0–149)
VLDL Cholesterol Cal: 12 mg/dL (ref 5–40)

## 2018-12-27 LAB — TSH: TSH: 7.32 u[IU]/mL — ABNORMAL HIGH (ref 0.450–4.500)

## 2018-12-27 LAB — HEMOGLOBIN A1C
Est. average glucose Bld gHb Est-mCnc: 114 mg/dL
Hgb A1c MFr Bld: 5.6 % (ref 4.8–5.6)

## 2019-01-01 ENCOUNTER — Encounter: Payer: Self-pay | Admitting: Adult Health

## 2019-01-01 ENCOUNTER — Ambulatory Visit (INDEPENDENT_AMBULATORY_CARE_PROVIDER_SITE_OTHER): Payer: 59 | Admitting: Adult Health

## 2019-01-01 ENCOUNTER — Other Ambulatory Visit: Payer: Self-pay

## 2019-01-01 VITALS — BP 138/84 | HR 86 | Temp 98.8°F | Ht 64.0 in | Wt 114.7 lb

## 2019-01-01 DIAGNOSIS — R7989 Other specified abnormal findings of blood chemistry: Secondary | ICD-10-CM | POA: Diagnosis not present

## 2019-01-01 DIAGNOSIS — F909 Attention-deficit hyperactivity disorder, unspecified type: Secondary | ICD-10-CM | POA: Diagnosis not present

## 2019-01-01 DIAGNOSIS — R5383 Other fatigue: Secondary | ICD-10-CM

## 2019-01-01 DIAGNOSIS — Z0000 Encounter for general adult medical examination without abnormal findings: Secondary | ICD-10-CM

## 2019-01-01 DIAGNOSIS — Z23 Encounter for immunization: Secondary | ICD-10-CM

## 2019-01-01 DIAGNOSIS — Z113 Encounter for screening for infections with a predominantly sexual mode of transmission: Secondary | ICD-10-CM

## 2019-01-01 NOTE — Progress Notes (Signed)
Subjective:    Patient ID: Michaela Bryant, female    DOB: 1975/11/30, 43 y.o.   MRN: [id number]  HPI:  Michaela Bryant is here for CPE She has not been exercising regularly due to gym restriction r/t to pandemic, however has been trying to remain as active as possible. She reports continued hair breakage/loss and increase in fatigue- sig increase in TSH. She reports good focus/concentration on Adderall 7m BID, with the occasional 114mPRN. She denies acute cardiac sx's/insomnia. She has been managing the stress of divorce proceedings, demanding work schedule (property mgt), raising two daughters, her mother recently separated from her husband, and the pandemic. She has been dating several men- she would like STI panel- she denies urinary/genital sx's.  12/26/2018 Labs: A1c-5.6 The 10-year ASCVD risk score (GMikey BussingC JrBrooke Bonito et al., 2013) is: 0.5%   Values used to calculate the score:     Age: [age]ears     Sex: Female     Is Non-Hispanic African American: No     Diabetic: No     Tobacco smoker: No     Systolic Blood Pressure: 13301mHg     Is BP treated: No     HDL Cholesterol: 60 mg/dL     Total Cholesterol: 151 mg/dL  LDL-79 CMP-slight elevation in AST-66 CBC-stable TSH-7.320-HIGH, jump from 2.880 last year Free T4- 1.12 T3- 202- LOW Recommend Thyroid [place] referral to Endo She reports family hx of Grave's (grandmother)  Healthcare Maintenance: Mammogram-UTD, 12/07/18-repeat  PAP-she will schedule with OB/GYN Immunizations-UTD  Patient Care Team    Relationship Specialty Notifications Start End  DaMina Marble, NP PCP - General Family Medicine  06/05/17   StErline LevineMD Consulting Physician Neurosurgery  06/05/17   MaJuanita CraverMD Consulting Physician Gastroenterology  06/05/17   WhHarriett SineMD Consulting Physician Dermatology  06/05/17   CoServando SalinaMD Consulting Physician Obstetrics and Gynecology  06/05/17   MaStephannie LiODRock CreekOphthalmology  06/05/17      Patient Active Problem List   Diagnosis Date Noted  . Elevated TSH 01/01/2019  . T3 low in serum 01/01/2019  . Fatigue 12/25/2017  . Hair loss 12/25/2017  . ADD (attention deficit disorder) 06/05/2017  . Healthcare maintenance 06/05/2017  . GAD (generalized anxiety disorder) 06/05/2017  . Pinched vertebral nerve 06/05/2017  . [place]     Past Medical History:  Diagnosis Date  . ADD (attention deficit disorder)   . Celiac disease   . Depression   . Pinched vertebral nerve      History reviewed. No pertinent surgical history.   Family History  Problem Relation Age of Onset  . Alcohol abuse Mother   . Cancer Mother        breast  . Alcohol abuse Maternal Aunt   . Alcohol abuse Maternal Uncle   . Diabetes Maternal Grandmother   . Hypertension Maternal Grandmother   . Alcohol abuse Maternal Grandfather   . Hypertension Maternal Grandfather   . Stroke Paternal Grandfather      Social History   Substance and Sexual Activity  Drug Use No     Social History   Substance and Sexual Activity  Alcohol Use Yes  . Alcohol/week: 3.0 standard drinks  . Types: 3 Glasses of wine per week     Social History   Tobacco Use  Smoking Status Never Smoker  Smokeless Tobacco Never Used     Outpatient Encounter Medications as of 01/01/2019  Medication  Sig  . acetaminophen (TYLENOL) 500 MG tablet Take 1,000 mg by mouth every 6 (six) hours as needed for mild pain or headache.  . amphetamine-dextroamphetamine (ADDERALL) 10 MG tablet 1 tablet by mouth daily  . amphetamine-dextroamphetamine (ADDERALL) 30 MG tablet Take 1 tablet by mouth 2 (two) times daily.  . hydrochlorothiazide (MICROZIDE) 12.5 MG capsule TAKE 1 CAPSULE BY MOUTH DAILY IN THE MORNING AS NEEDED FOR SWELLING/FLUID  . ibuprofen (ADVIL,MOTRIN) 200 MG tablet Take 800 mg by mouth every 8 (eight) hours as needed for headache or mild pain.   . Multiple Vitamin (MULTIVITAMIN) capsule Take 1  capsule by mouth daily.  . ondansetron (ZOFRAN-ODT) 8 MG disintegrating tablet Take 1 tablet (8 mg total) by mouth every 8 (eight) hours as needed for nausea or vomiting.  . traZODone (DESYREL) 50 MG tablet Take 0.5-1 tablets (25-50 mg total) by mouth at bedtime as needed for sleep.   No facility-administered encounter medications on file as of 01/01/2019.    Allergies: Gluten meal  Body mass index is 19.69 kg/m.  Blood pressure 138/84, pulse 86, temperature 98.8 F (37.1 C), temperature source Oral, height 5' 4"  (1.626 m), weight 114 lb 11.2 oz (52 kg), last menstrual period 12/11/2018, SpO2 100 %.   Review of Systems  Constitutional: Positive for fatigue. Negative for activity change, appetite change, chills, diaphoresis, fever and unexpected weight change.  HENT: Negative for congestion.   Eyes: Negative for visual disturbance.  Respiratory: Negative for cough, chest tightness, shortness of breath, wheezing and stridor.   Cardiovascular: Negative for chest pain, palpitations and leg swelling.  Gastrointestinal: Negative for abdominal distention, abdominal pain, blood in stool, constipation, diarrhea, nausea and vomiting.  Endocrine: Negative for polydipsia, polyphagia and polyuria.  Genitourinary: Negative for difficulty urinating and flank pain.  Musculoskeletal: Negative for arthralgias, back pain, gait problem, joint swelling, myalgias, neck pain and neck stiffness.  Neurological: Negative for dizziness and headaches.  Hematological: Negative for adenopathy. Does not bruise/bleed easily.  Psychiatric/Behavioral: Negative for agitation, behavioral problems, confusion, decreased concentration, dysphoric mood, hallucinations, self-injury, sleep disturbance and suicidal ideas. The patient is not nervous/anxious and is not hyperactive.        Objective:   Physical Exam Vitals and nursing note reviewed.  Constitutional:      General: She is not in acute distress.    Appearance:  Normal appearance. She is normal weight. She is not ill-appearing, toxic-appearing or diaphoretic.  HENT:     Head: Normocephalic and atraumatic.     Right Ear: Tympanic membrane, ear canal and external ear normal. There is no impacted cerumen.     Left Ear: Tympanic membrane, ear canal and external ear normal. There is no impacted cerumen.     Nose: Nose normal. No congestion.     Mouth/Throat:     Mouth: Mucous membranes are moist.     Pharynx: No oropharyngeal exudate.  Eyes:     Extraocular Movements: Extraocular movements intact.     Conjunctiva/sclera: Conjunctivae normal.     Pupils: Pupils are equal, round, and reactive to light.  Neck:     Thyroid: No thyroid mass, thyromegaly or thyroid tenderness.  Cardiovascular:     Rate and Rhythm: Normal rate and regular rhythm.     Pulses: Normal pulses.     Heart sounds: Normal heart sounds. No murmur. No friction rub. No gallop.   Pulmonary:     Effort: Pulmonary effort is normal. No respiratory distress.     Breath sounds: Normal breath sounds.  No stridor. No wheezing, rhonchi or rales.  Chest:     Chest wall: No tenderness.  Abdominal:     General: Abdomen is flat. Bowel sounds are normal. There is no distension.     Palpations: Abdomen is soft. There is no mass.     Tenderness: There is no abdominal tenderness. There is no right CVA tenderness, left CVA tenderness, guarding or rebound.     Hernia: No hernia is present.  Musculoskeletal:        General: Normal range of motion.     Cervical back: Normal range of motion and neck supple. No tenderness.     Right lower leg: Edema present.     Left lower leg: Edema present.     Right foot: Swelling present.     Left foot: Swelling present.     Comments: 1+ edema in feet bilaterally   Lymphadenopathy:     Cervical: No cervical adenopathy.  Skin:    General: Skin is warm and dry.     Capillary Refill: Capillary refill takes less than 2 seconds.  Neurological:     Mental Status:  She is alert and oriented to person, place, and time.     Coordination: Coordination normal.  Psychiatric:        Mood and Affect: Mood normal.        Behavior: Behavior normal.        Thought Content: Thought content normal.        Judgment: Judgment normal.       Assessment & Plan:   1. Screening for STD (sexually transmitted disease)   2. Need for influenza vaccination   3. Need for Tdap vaccination   4. T3 low in serum   5. Elevated TSH   6. Healthcare maintenance   7. Attention deficit hyperactivity disorder (ADHD), unspecified ADHD type   8. Fatigue, unspecified type     Healthcare maintenance TSH elevated, T3 decreased, with fatigue and hair loss= will send of Thyroid Ultrasound and referral to Endocrinology. Please schedule PAP with OB/GYN. Continue to social distance and wear a mask when in public. We will contact you with lab results.  Follow-up in 3 months.  ADD (attention deficit disorder) PDMP reviewed- she is appropriately requesting refills for Adderall 77m BID, Adderall 111mQD PRN She denies palpitations/insomnia   T3 low in serum TSH elevated, T3 decreased, with fatigue and hair loss= will send of Thyroid Ultrasound and referral to Endocrinology.  Elevated TSH TSH elevated, T3 decreased, with fatigue and hair loss= will send of Thyroid Ultrasound and referral to Endocrinology.  Fatigue TSH elevated, T3 decreased, with fatigue and hair loss= will send of Thyroid Ultrasound and referral to Endocrinology.    FOLLOW-UP:  Return in about 3 months (around 04/01/2019) for Regular Follow Up, ADHD Medication.

## 2019-01-01 NOTE — Assessment & Plan Note (Signed)
PDMP reviewed- she is appropriately requesting refills for Adderall 37m BID, Adderall 127mQD PRN She denies palpitations/insomnia

## 2019-01-01 NOTE — Patient Instructions (Signed)
Preventive Care for Adults, Female  A healthy lifestyle and preventive care can promote health and wellness. Preventive health guidelines for women include the following key practices.   A routine yearly physical is a good way to check with your health care provider about your health and preventive screening. It is a chance to share any concerns and updates on your health and to receive a thorough exam.   Visit your dentist for a routine exam and preventive care every 6 months. Brush your teeth twice a day and floss once a day. Good oral hygiene prevents tooth decay and gum disease.   The frequency of eye exams is based on your age, health, family medical history, use of contact lenses, and other factors. Follow your health care provider's recommendations for frequency of eye exams.   Eat a healthy diet. Foods like vegetables, fruits, whole grains, low-fat dairy products, and lean protein foods contain the nutrients you need without too many calories. Decrease your intake of foods high in solid fats, added sugars, and salt. Eat the right amount of calories for you.Get information about a proper diet from your health care provider, if necessary.   Regular physical exercise is one of the most important things you can do for your health. Most adults should get at least 150 minutes of moderate-intensity exercise (any activity that increases your heart rate and causes you to sweat) each week. In addition, most adults need muscle-strengthening exercises on 2 or more days a week.   Maintain a healthy weight. The body mass index (BMI) is a screening tool to identify possible weight problems. It provides an estimate of body fat based on height and weight. Your health care provider can find your BMI, and can help you achieve or maintain a healthy weight.For adults 20 years and older:   - A BMI below 18.5 is considered underweight.   - A BMI of 18.5 to 24.9 is normal.   - A BMI of 25 to 29.9 is  considered overweight.   - A BMI of 30 and above is considered obese.   Maintain normal blood lipids and cholesterol levels by exercising and minimizing your intake of trans and saturated fats.  Eat a balanced diet with plenty of fruit and vegetables. Blood tests for lipids and cholesterol should begin at age 20 and be repeated every 5 years minimum.  If your lipid or cholesterol levels are high, you are over 40, or you are at high risk for heart disease, you may need your cholesterol levels checked more frequently.Ongoing high lipid and cholesterol levels should be treated with medicines if diet and exercise are not working.   If you smoke, find out from your health care provider how to quit. If you do not use tobacco, do not start.   Lung cancer screening is recommended for adults aged 55-80 years who are at high risk for developing lung cancer because of a history of smoking. A yearly low-dose CT scan of the lungs is recommended for people who have at least a 30-pack-year history of smoking and are a current smoker or have quit within the past 15 years. A pack year of smoking is smoking an average of 1 pack of cigarettes a day for 1 year (for example: 1 pack a day for 30 years or 2 packs a day for 15 years). Yearly screening should continue until the smoker has stopped smoking for at least 15 years. Yearly screening should be stopped for people who develop a   health problem that would prevent them from having lung cancer treatment.   If you are pregnant, do not drink alcohol. If you are breastfeeding, be very cautious about drinking alcohol. If you are not pregnant and choose to drink alcohol, do not have more than 1 drink per day. One drink is considered to be 12 ounces (355 mL) of beer, 5 ounces (148 mL) of wine, or 1.5 ounces (44 mL) of liquor.   Avoid use of street drugs. Do not share needles with anyone. Ask for help if you need support or instructions about stopping the use of  drugs.   High blood pressure causes heart disease and increases the risk of stroke. Your blood pressure should be checked at least yearly.  Ongoing high blood pressure should be treated with medicines if weight loss and exercise do not work.   If you are 69-55 years old, ask your health care provider if you should take aspirin to prevent strokes.   Diabetes screening involves taking a blood sample to check your fasting blood sugar level. This should be done once every 3 years, after age 38, if you are within normal weight and without risk factors for diabetes. Testing should be considered at a younger age or be carried out more frequently if you are overweight and have at least 1 risk factor for diabetes.   Breast cancer screening is essential preventive care for women. You should practice "breast self-awareness."  This means understanding the normal appearance and feel of your breasts and may include breast self-examination.  Any changes detected, no matter how small, should be reported to a health care provider.  Women in their 80s and 30s should have a clinical breast exam (CBE) by a health care provider as part of a regular health exam every 1 to 3 years.  After age 66, women should have a CBE every year.  Starting at age 1, women should consider having a mammogram (breast X-ray test) every year.  Women who have a family history of breast cancer should talk to their health care provider about genetic screening.  Women at a high risk of breast cancer should talk to their health care providers about having an MRI and a mammogram every year.   -Breast cancer gene (BRCA)-related cancer risk assessment is recommended for women who have family members with BRCA-related cancers. BRCA-related cancers include breast, ovarian, tubal, and peritoneal cancers. Having family members with these cancers may be associated with an increased risk for harmful changes (mutations) in the breast cancer genes BRCA1 and  BRCA2. Results of the assessment will determine the need for genetic counseling and BRCA1 and BRCA2 testing.   The Pap test is a screening test for cervical cancer. A Pap test can show cell changes on the cervix that might become cervical cancer if left untreated. A Pap test is a procedure in which cells are obtained and examined from the lower end of the uterus (cervix).   - Women should have a Pap test starting at age 57.   - Between ages 90 and 70, Pap tests should be repeated every 2 years.   - Beginning at age 63, you should have a Pap test every 3 years as long as the past 3 Pap tests have been normal.   - Some women have medical problems that increase the chance of getting cervical cancer. Talk to your health care provider about these problems. It is especially important to talk to your health care provider if a  new problem develops soon after your last Pap test. In these cases, your health care provider may recommend more frequent screening and Pap tests.   - The above recommendations are the same for women who have or have not gotten the vaccine for human papillomavirus (HPV).   - If you had a hysterectomy for a problem that was not cancer or a condition that could lead to cancer, then you no longer need Pap tests. Even if you no longer need a Pap test, a regular exam is a good idea to make sure no other problems are starting.   - If you are between ages 36 and 66 years, and you have had normal Pap tests going back 10 years, you no longer need Pap tests. Even if you no longer need a Pap test, a regular exam is a good idea to make sure no other problems are starting.   - If you have had past treatment for cervical cancer or a condition that could lead to cancer, you need Pap tests and screening for cancer for at least 20 years after your treatment.   - If Pap tests have been discontinued, risk factors (such as a new sexual partner) need to be reassessed to determine if screening should  be resumed.   - The HPV test is an additional test that may be used for cervical cancer screening. The HPV test looks for the virus that can cause the cell changes on the cervix. The cells collected during the Pap test can be tested for HPV. The HPV test could be used to screen women aged 70 years and older, and should be used in women of any age who have unclear Pap test results. After the age of 67, women should have HPV testing at the same frequency as a Pap test.   Colorectal cancer can be detected and often prevented. Most routine colorectal cancer screening begins at the age of 57 years and continues through age 26 years. However, your health care provider may recommend screening at an earlier age if you have risk factors for colon cancer. On a yearly basis, your health care provider may provide home test kits to check for hidden blood in the stool.  Use of a small camera at the end of a tube, to directly examine the colon (sigmoidoscopy or colonoscopy), can detect the earliest forms of colorectal cancer. Talk to your health care provider about this at age 23, when routine screening begins. Direct exam of the colon should be repeated every 5 -10 years through age 49 years, unless early forms of pre-cancerous polyps or small growths are found.   People who are at an increased risk for hepatitis B should be screened for this virus. You are considered at high risk for hepatitis B if:  -You were born in a country where hepatitis B occurs often. Talk with your health care provider about which countries are considered high risk.  - Your parents were born in a high-risk country and you have not received a shot to protect against hepatitis B (hepatitis B vaccine).  - You have HIV or AIDS.  - You use needles to inject street drugs.  - You live with, or have sex with, someone who has Hepatitis B.  - You get hemodialysis treatment.  - You take certain medicines for conditions like cancer, organ  transplantation, and autoimmune conditions.   Hepatitis C blood testing is recommended for all people born from [birth year] through 1965 and any individual  with known risks for hepatitis C.   Practice safe sex. Use condoms and avoid high-risk sexual practices to reduce the spread of sexually transmitted infections (STIs). STIs include gonorrhea, chlamydia, syphilis, trichomonas, herpes, HPV, and human immunodeficiency virus (HIV). Herpes, HIV, and HPV are viral illnesses that have no cure. They can result in disability, cancer, and death. Sexually active women aged 25 years and younger should be checked for chlamydia. Older women with new or multiple partners should also be tested for chlamydia. Testing for other STIs is recommended if you are sexually active and at increased risk.   Osteoporosis is a disease in which the bones lose minerals and strength with aging. This can result in serious bone fractures or breaks. The risk of osteoporosis can be identified using a bone density scan. Women ages 65 years and over and women at risk for fractures or osteoporosis should discuss screening with their health care providers. Ask your health care provider whether you should take a calcium supplement or vitamin D to There are also several preventive steps women can take to avoid osteoporosis and resulting fractures or to keep osteoporosis from worsening. -->Recommendations include:  Eat a balanced diet high in fruits, vegetables, calcium, and vitamins.  Get enough calcium. The recommended total intake of is 1,200 mg daily; for best absorption, if taking supplements, divide doses into 250-500 mg doses throughout the day. Of the two types of calcium, calcium carbonate is best absorbed when taken with food but calcium citrate can be taken on an empty stomach.  Get enough vitamin D. NAMS and the National Osteoporosis Foundation recommend at least 1,000 IU per day for women age 50 and over who are at risk of vitamin D  deficiency. Vitamin D deficiency can be caused by inadequate sun exposure (for example, those who live in northern latitudes).  Avoid alcohol and smoking. Heavy alcohol intake (more than 7 drinks per week) increases the risk of falls and hip fracture and women smokers tend to lose bone more rapidly and have lower bone mass than nonsmokers. Stopping smoking is one of the most important changes women can make to improve their health and decrease risk for disease.  Be physically active every day. Weight-bearing exercise (for example, fast walking, hiking, jogging, and weight training) may strengthen bones or slow the rate of bone loss that comes with aging. Balancing and muscle-strengthening exercises can reduce the risk of falling and fracture.  Consider therapeutic medications. Currently, several types of effective drugs are available. Healthcare providers can recommend the type most appropriate for each woman.  Eliminate environmental factors that may contribute to accidents. Falls cause nearly 90% of all osteoporotic fractures, so reducing this risk is an important bone-health strategy. Measures include ample lighting, removing obstructions to walking, using nonskid rugs on floors, and placing mats and/or grab bars in showers.  Be aware of medication side effects. Some common medicines make bones weaker. These include a type of steroid drug called glucocorticoids used for arthritis and asthma, some antiseizure drugs, certain sleeping pills, treatments for endometriosis, and some cancer drugs. An overactive thyroid gland or using too much thyroid hormone for an underactive thyroid can also be a problem. If you are taking these medicines, talk to your doctor about what you can do to help protect your bones.reduce the rate of osteoporosis.    Menopause can be associated with physical symptoms and risks. Hormone replacement therapy is available to decrease symptoms and risks. You should talk to your  health care provider   about whether hormone replacement therapy is right for you.   Use sunscreen. Apply sunscreen liberally and repeatedly throughout the day. You should seek shade when your shadow is shorter than you. Protect yourself by wearing long sleeves, pants, a wide-brimmed hat, and sunglasses year round, whenever you are outdoors.   Once a month, do a whole body skin exam, using a mirror to look at the skin on your back. Tell your health care provider of new moles, moles that have irregular borders, moles that are larger than a pencil eraser, or moles that have changed in shape or color.   -Stay current with required vaccines (immunizations).   Influenza vaccine. All adults should be immunized every year.  Tetanus, diphtheria, and acellular pertussis (Td, Tdap) vaccine. Pregnant women should receive 1 dose of Tdap vaccine during each pregnancy. The dose should be obtained regardless of the length of time since the last dose. Immunization is preferred during the 27th 36th week of gestation. An adult who has not previously received Tdap or who does not know her vaccine status should receive 1 dose of Tdap. This initial dose should be followed by tetanus and diphtheria toxoids (Td) booster doses every 10 years. Adults with an unknown or incomplete history of completing a 3-dose immunization series with Td-containing vaccines should begin or complete a primary immunization series including a Tdap dose. Adults should receive a Td booster every 10 years.  Varicella vaccine. An adult without evidence of immunity to varicella should receive 2 doses or a second dose if she has previously received 1 dose. Pregnant females who do not have evidence of immunity should receive the first dose after pregnancy. This first dose should be obtained before leaving the health care facility. The second dose should be obtained 4 8 weeks after the first dose.  Human papillomavirus (HPV) vaccine. Females aged 13 26  years who have not received the vaccine previously should obtain the 3-dose series. The vaccine is not recommended for use in pregnant females. However, pregnancy testing is not needed before receiving a dose. If a female is found to be pregnant after receiving a dose, no treatment is needed. In that case, the remaining doses should be delayed until after the pregnancy. Immunization is recommended for any person with an immunocompromised condition through the age of 26 years if she did not get any or all doses earlier. During the 3-dose series, the second dose should be obtained 4 8 weeks after the first dose. The third dose should be obtained 24 weeks after the first dose and 16 weeks after the second dose.  Zoster vaccine. One dose is recommended for adults aged 60 years or older unless certain conditions are present.  Measles, mumps, and rubella (MMR) vaccine. Adults born before 1957 generally are considered immune to measles and mumps. Adults born in 1957 or later should have 1 or more doses of MMR vaccine unless there is a contraindication to the vaccine or there is laboratory evidence of immunity to each of the three diseases. A routine second dose of MMR vaccine should be obtained at least 28 days after the first dose for students attending postsecondary schools, health care workers, or international travelers. People who received inactivated measles vaccine or an unknown type of measles vaccine during 1963 1967 should receive 2 doses of MMR vaccine. People who received inactivated mumps vaccine or an unknown type of mumps vaccine before 1979 and are at high risk for mumps infection should consider immunization with 2 doses of   MMR vaccine. For females of childbearing age, rubella immunity should be determined. If there is no evidence of immunity, females who are not pregnant should be vaccinated. If there is no evidence of immunity, females who are pregnant should delay immunization until after pregnancy.  Unvaccinated health care workers born before [birth year] who lack laboratory evidence of measles, mumps, or rubella immunity or laboratory confirmation of disease should consider measles and mumps immunization with 2 doses of MMR vaccine or rubella immunization with 1 dose of MMR vaccine.  Pneumococcal 13-valent conjugate (PCV13) vaccine. When indicated, a person who is uncertain of her immunization history and has no record of immunization should receive the PCV13 vaccine. An adult aged 54 years or older who has certain medical conditions and has not been previously immunized should receive 1 dose of PCV13 vaccine. This PCV13 should be followed with a dose of pneumococcal polysaccharide (PPSV23) vaccine. The PPSV23 vaccine dose should be obtained at least 8 weeks after the dose of PCV13 vaccine. An adult aged 58 years or older who has certain medical conditions and previously received 1 or more doses of PPSV23 vaccine should receive 1 dose of PCV13. The PCV13 vaccine dose should be obtained 1 or more years after the last PPSV23 vaccine dose.  Pneumococcal polysaccharide (PPSV23) vaccine. When PCV13 is also indicated, PCV13 should be obtained first. All adults aged 58 years and older should be immunized. An adult younger than age 65 years who has certain medical conditions should be immunized. Any person who resides in a nursing home or long-term care facility should be immunized. An adult smoker should be immunized. People with an immunocompromised condition and certain other conditions should receive both PCV13 and PPSV23 vaccines. People with human immunodeficiency virus (HIV) infection should be immunized as soon as possible after diagnosis. Immunization during chemotherapy or radiation therapy should be avoided. Routine use of PPSV23 vaccine is not recommended for American Indians, [place] Natives, or people younger than 65 years unless there are medical conditions that require PPSV23 vaccine. When indicated,  people who have unknown immunization and have no record of immunization should receive PPSV23 vaccine. One-time revaccination 5 years after the first dose of PPSV23 is recommended for people aged 70 64 years who have chronic kidney failure, nephrotic syndrome, asplenia, or immunocompromised conditions. People who received 1 2 doses of PPSV23 before age 32 years should receive another dose of PPSV23 vaccine at age 96 years or later if at least 5 years have passed since the previous dose. Doses of PPSV23 are not needed for people immunized with PPSV23 at or after age 55 years.  Meningococcal vaccine. Adults with asplenia or persistent complement component deficiencies should receive 2 doses of quadrivalent meningococcal conjugate (MenACWY-D) vaccine. The doses should be obtained at least 2 months apart. Microbiologists working with certain meningococcal bacteria, [employer] recruits, people at risk during an outbreak, and people who travel to or live in countries with a high rate of meningitis should be immunized. A first-year college student up through age 58 years who is living in a residence hall should receive a dose if she did not receive a dose on or after her 16th birthday. Adults who have certain high-risk conditions should receive one or more doses of vaccine.  Hepatitis A vaccine. Adults who wish to be protected from this disease, have certain high-risk conditions, work with hepatitis A-infected animals, work in hepatitis A research labs, or travel to or work in countries with a high rate of hepatitis A should be  immunized. Adults who were previously unvaccinated and who anticipate close contact with an international adoptee during the first 60 days after arrival in the [place] from a country with a high rate of hepatitis A should be immunized.  Hepatitis B vaccine.  Adults who wish to be protected from this disease, have certain high-risk conditions, may be exposed to blood or other infectious  body fluids, are household contacts or sex partners of hepatitis B positive people, are clients or workers in certain care facilities, or travel to or work in countries with a high rate of hepatitis B should be immunized.  Haemophilus influenzae type b (Hib) vaccine. A previously unvaccinated person with asplenia or sickle cell disease or having a scheduled splenectomy should receive 1 dose of Hib vaccine. Regardless of previous immunization, a recipient of a hematopoietic stem cell transplant should receive a 3-dose series 6 12 months after her successful transplant. Hib vaccine is not recommended for adults with HIV infection.  Preventive Services / Frequency Ages 6 to 39years  Blood pressure check.** / Every 1 to 2 years.  Lipid and cholesterol check.** / Every 5 years beginning at age 39.  Clinical breast exam.** / Every 3 years for women in their 61s and 62s.  BRCA-related cancer risk assessment.** / For women who have family members with a BRCA-related cancer (breast, ovarian, tubal, or peritoneal cancers).  Pap test.** / Every 2 years from ages 47 through 85. Every 3 years starting at age 34 through age 12 or 74 with a history of 3 consecutive normal Pap tests.  HPV screening.** / Every 3 years from ages 46 through ages 43 to 54 with a history of 3 consecutive normal Pap tests.  Hepatitis C blood test.** / For any individual with known risks for hepatitis C.  Skin self-exam. / Monthly.  Influenza vaccine. / Every year.  Tetanus, diphtheria, and acellular pertussis (Tdap, Td) vaccine.** / Consult your health care provider. Pregnant women should receive 1 dose of Tdap vaccine during each pregnancy. 1 dose of Td every 10 years.  Varicella vaccine.** / Consult your health care provider. Pregnant females who do not have evidence of immunity should receive the first dose after pregnancy.  HPV vaccine. / 3 doses over 6 months, if 64 and younger. The vaccine is not recommended for use in  pregnant females. However, pregnancy testing is not needed before receiving a dose.  Measles, mumps, rubella (MMR) vaccine.** / You need at least 1 dose of MMR if you were born in 1957 or later. You may also need a 2nd dose. For females of childbearing age, rubella immunity should be determined. If there is no evidence of immunity, females who are not pregnant should be vaccinated. If there is no evidence of immunity, females who are pregnant should delay immunization until after pregnancy.  Pneumococcal 13-valent conjugate (PCV13) vaccine.** / Consult your health care provider.  Pneumococcal polysaccharide (PPSV23) vaccine.** / 1 to 2 doses if you smoke cigarettes or if you have certain conditions.  Meningococcal vaccine.** / 1 dose if you are age 71 to 35 years and a Market researcher living in a residence hall, or have one of several medical conditions, you need to get vaccinated against meningococcal disease. You may also need additional booster doses.  Hepatitis A vaccine.** / Consult your health care provider.  Hepatitis B vaccine.** / Consult your health care provider.  Haemophilus influenzae type b (Hib) vaccine.** / Consult your health care provider.  Ages 55 to 64years  Blood pressure check.** / Every 1 to 2 years.  Lipid and cholesterol check.** / Every 5 years beginning at age 20 years.  Lung cancer screening. / Every year if you are aged 55 80 years and have a 30-pack-year history of smoking and currently smoke or have quit within the past 15 years. Yearly screening is stopped once you have quit smoking for at least 15 years or develop a health problem that would prevent you from having lung cancer treatment.  Clinical breast exam.** / Every year after age 40 years.  BRCA-related cancer risk assessment.** / For women who have family members with a BRCA-related cancer (breast, ovarian, tubal, or peritoneal cancers).  Mammogram.** / Every year beginning at age 40  years and continuing for as long as you are in good health. Consult with your health care provider.  Pap test.** / Every 3 years starting at age 30 years through age 65 or 70 years with a history of 3 consecutive normal Pap tests.  HPV screening.** / Every 3 years from ages 30 years through ages 65 to 70 years with a history of 3 consecutive normal Pap tests.  Fecal occult blood test (FOBT) of stool. / Every year beginning at age 50 years and continuing until age 75 years. You may not need to do this test if you get a colonoscopy every 10 years.  Flexible sigmoidoscopy or colonoscopy.** / Every 5 years for a flexible sigmoidoscopy or every 10 years for a colonoscopy beginning at age 50 years and continuing until age 75 years.  Hepatitis C blood test.** / For all people born from 1945 through 1965 and any individual with known risks for hepatitis C.  Skin self-exam. / Monthly.  Influenza vaccine. / Every year.  Tetanus, diphtheria, and acellular pertussis (Tdap/Td) vaccine.** / Consult your health care provider. Pregnant women should receive 1 dose of Tdap vaccine during each pregnancy. 1 dose of Td every 10 years.  Varicella vaccine.** / Consult your health care provider. Pregnant females who do not have evidence of immunity should receive the first dose after pregnancy.  Zoster vaccine.** / 1 dose for adults aged 60 years or older.  Measles, mumps, rubella (MMR) vaccine.** / You need at least 1 dose of MMR if you were born in 1957 or later. You may also need a 2nd dose. For females of childbearing age, rubella immunity should be determined. If there is no evidence of immunity, females who are not pregnant should be vaccinated. If there is no evidence of immunity, females who are pregnant should delay immunization until after pregnancy.  Pneumococcal 13-valent conjugate (PCV13) vaccine.** / Consult your health care provider.  Pneumococcal polysaccharide (PPSV23) vaccine.** / 1 to 2 doses if  you smoke cigarettes or if you have certain conditions.  Meningococcal vaccine.** / Consult your health care provider.  Hepatitis A vaccine.** / Consult your health care provider.  Hepatitis B vaccine.** / Consult your health care provider.  Haemophilus influenzae type b (Hib) vaccine.** / Consult your health care provider.  Ages 65 years and over  Blood pressure check.** / Every 1 to 2 years.  Lipid and cholesterol check.** / Every 5 years beginning at age 20 years.  Lung cancer screening. / Every year if you are aged 55 80 years and have a 30-pack-year history of smoking and currently smoke or have quit within the past 15 years. Yearly screening is stopped once you have quit smoking for at least 15 years or develop a health problem that   would prevent you from having lung cancer treatment.  Clinical breast exam.** / Every year after age 103 years.  BRCA-related cancer risk assessment.** / For women who have family members with a BRCA-related cancer (breast, ovarian, tubal, or peritoneal cancers).  Mammogram.** / Every year beginning at age 36 years and continuing for as long as you are in good health. Consult with your health care provider.  Pap test.** / Every 3 years starting at age 5 years through age 85 or 10 years with 3 consecutive normal Pap tests. Testing can be stopped between 65 and 70 years with 3 consecutive normal Pap tests and no abnormal Pap or HPV tests in the past 10 years.  HPV screening.** / Every 3 years from ages 93 years through ages 70 or 45 years with a history of 3 consecutive normal Pap tests. Testing can be stopped between 65 and 70 years with 3 consecutive normal Pap tests and no abnormal Pap or HPV tests in the past 10 years.  Fecal occult blood test (FOBT) of stool. / Every year beginning at age 8 years and continuing until age 45 years. You may not need to do this test if you get a colonoscopy every 10 years.  Flexible sigmoidoscopy or colonoscopy.** /  Every 5 years for a flexible sigmoidoscopy or every 10 years for a colonoscopy beginning at age 69 years and continuing until age 68 years.  Hepatitis C blood test.** / For all people born from [birth year] through 1967 and any individual with known risks for hepatitis C.  Osteoporosis screening.** / A one-time screening for women ages 7 years and over and women at risk for fractures or osteoporosis.  Skin self-exam. / Monthly.  Influenza vaccine. / Every year.  Tetanus, diphtheria, and acellular pertussis (Tdap/Td) vaccine.** / 1 dose of Td every 10 years.  Varicella vaccine.** / Consult your health care provider.  Zoster vaccine.** / 1 dose for adults aged 5 years or older.  Pneumococcal 13-valent conjugate (PCV13) vaccine.** / Consult your health care provider.  Pneumococcal polysaccharide (PPSV23) vaccine.** / 1 dose for all adults aged 74 years and older.  Meningococcal vaccine.** / Consult your health care provider.  Hepatitis A vaccine.** / Consult your health care provider.  Hepatitis B vaccine.** / Consult your health care provider.  Haemophilus influenzae type b (Hib) vaccine.** / Consult your health care provider. ** Family history and personal history of risk and conditions may change your health care provider's recommendations. Document Released: 02/15/2001 Document Revised: 10/10/2012  [facility] Patient Information 2014 [place], [place].   EXERCISE AND DIET:  We recommended that you start or continue a regular exercise program for good health. Regular exercise means any activity that makes your heart beat faster and makes you sweat.  We recommend exercising at least 30 minutes per day at least 3 days a week, preferably 5.  We also recommend a diet low in fat and sugar / carbohydrates.  Inactivity, poor dietary choices and obesity can cause diabetes, heart attack, stroke, and kidney damage, among others.     ALCOHOL AND SMOKING:  Women should limit their alcohol intake to no  more than 7 drinks/beers/glasses of wine (combined, not each!) per week. Moderation of alcohol intake to this level decreases your risk of breast cancer and liver damage.  ( And of course, no recreational drugs are part of a healthy lifestyle.)  Also, you should not be smoking at all or even being exposed to second hand smoke. Most people know smoking can  cause cancer, and various heart and lung diseases, but did you know it also contributes to weakening of your bones?  Aging of your skin?  Yellowing of your teeth and nails?   CALCIUM AND VITAMIN D:  Adequate intake of calcium and Vitamin D are recommended.  The recommendations for exact amounts of these supplements seem to change often, but generally speaking 600 mg of calcium (either carbonate or citrate) and 800 units of Vitamin D per day seems prudent. Certain women may benefit from higher intake of Vitamin D.  If you are among these women, your doctor will have told you during your visit.     PAP SMEARS:  Pap smears, to check for cervical cancer or precancers,  have traditionally been done yearly, although recent scientific advances have shown that most women can have pap smears less often.  However, every woman still should have a physical exam from her gynecologist or primary care physician every year. It will include a breast check, inspection of the vulva and vagina to check for abnormal growths or skin changes, a visual exam of the cervix, and then an exam to evaluate the size and shape of the uterus and ovaries.  And after 45 years of age, a rectal exam is indicated to check for rectal cancers. We will also provide age appropriate advice regarding health maintenance, like when you should have certain vaccines, screening for sexually transmitted diseases, bone density testing, colonoscopy, mammograms, etc.    MAMMOGRAMS:  All women over 50 years old should have a yearly mammogram. Many facilities now offer a "3D" mammogram, which may cost  around $50 extra out of pocket. If possible,  we recommend you accept the option to have the 3D mammogram performed.  It both reduces the number of women who will be called back for extra views which then turn out to be normal, and it is better than the routine mammogram at detecting truly abnormal areas.     COLONOSCOPY:  Colonoscopy to screen for colon cancer is recommended for all women at age 37.  We know, you hate the idea of the prep.  We agree, BUT, having colon cancer and not knowing it is worse!!  Colon cancer so often starts as a polyp that can be seen and removed at colonscopy, which can quite literally save your life!  And if your first colonoscopy is normal and you have no family history of colon cancer, most women don't have to have it again for 10 years.  Once every ten years, you can do something that may end up saving your life, right?  We will be happy to help you get it scheduled when you are ready.  Be sure to check your insurance coverage so you understand how much it will cost.  It may be covered as a preventative service at no cost, but you should check your particular policy.   TSH elevated, T3 decreased, with fatigue and hair loss= will send of Thyroid Ultrasound and referral to Endocrinology. Please schedule PAP with OB/GYN. Continue to social distance and wear a mask when in public. We will contact you with lab results.  Follow-up in 3 months. GREAT TO SEE YOU!

## 2019-01-01 NOTE — Assessment & Plan Note (Signed)
TSH elevated, T3 decreased, with fatigue and hair loss= will send of Thyroid Ultrasound and referral to Endocrinology. Please schedule PAP with OB/GYN. Continue to social distance and wear a mask when in public. We will contact you with lab results.  Follow-up in 3 months.

## 2019-01-01 NOTE — Assessment & Plan Note (Signed)
TSH elevated, T3 decreased, with fatigue and hair loss= will send of Thyroid Ultrasound and referral to Endocrinology.

## 2019-01-03 ENCOUNTER — Other Ambulatory Visit: Payer: Self-pay | Admitting: Adult Health

## 2019-01-03 LAB — CHLAMYDIA/GONOCOCCUS/TRICHOMONAS, NAA
Chlamydia by NAA: NEGATIVE
Gonococcus by NAA: NEGATIVE
Trich vag by NAA: NEGATIVE

## 2019-01-03 LAB — HSV-2 IGG SUPPLEMENTAL TEST: HSV-2 IgG Supplemental Test: POSITIVE — AB

## 2019-01-03 LAB — HSV(HERPES SMPLX)ABS-I+II(IGG+IGM)-BLD
HSV 1 Glycoprotein G Ab, IgG: 1.92 index — ABNORMAL HIGH (ref 0.00–0.90)
HSV 2 IgG, Type Spec: 1.07 index — ABNORMAL HIGH (ref 0.00–0.90)
HSVI/II Comb IgM: 1.4 Ratio — ABNORMAL HIGH (ref 0.00–0.90)

## 2019-01-03 LAB — HEPATITIS B SURFACE ANTIGEN: Hepatitis B Surface Ag: NEGATIVE

## 2019-01-03 LAB — RPR: RPR Ser Ql: NONREACTIVE

## 2019-01-03 LAB — HIV ANTIBODY (ROUTINE TESTING W REFLEX): HIV Screen 4th Generation wRfx: NONREACTIVE

## 2019-01-03 LAB — HEPATITIS C ANTIBODY: Hep C Virus Ab: 0.1 s/co ratio (ref 0.0–0.9)

## 2019-01-03 MED ORDER — AMPHETAMINE-DEXTROAMPHETAMINE 10 MG PO TABS: ORAL_TABLET | ORAL | 0 refills | Status: DC

## 2019-01-03 MED FILL — AMPHETAMINE-DEXTROAMPHETAMI: 10 | 30 days supply | Qty: 30 | Fill #0

## 2019-01-05 LAB — T4, FREE: Free T4: 1.12 ng/dL (ref 0.82–1.77)

## 2019-01-05 LAB — T3: T3, Total: 202 ng/dL — ABNORMAL HIGH (ref 71–180)

## 2019-01-05 LAB — SPECIMEN STATUS REPORT

## 2019-01-14 ENCOUNTER — Ambulatory Visit
Admission: RE | Admit: 2019-01-14 | Discharge: 2019-01-14 | Disposition: A | Discharge status: Home / Self Care | Payer: Self-pay | Source: Ambulatory Visit | Attending: Adult Health | Admitting: Adult Health

## 2019-01-14 DIAGNOSIS — R946 Abnormal results of thyroid function studies: Secondary | ICD-10-CM | POA: Diagnosis not present

## 2019-01-14 DIAGNOSIS — R7989 Other specified abnormal findings of blood chemistry: Secondary | ICD-10-CM

## 2019-01-15 ENCOUNTER — Encounter: Payer: Self-pay | Admitting: Adult Health

## 2019-01-21 ENCOUNTER — Other Ambulatory Visit: Payer: Self-pay | Admitting: Adult Health

## 2019-01-21 MED ORDER — AMPHETAMINE-DEXTROAMPHETAMINE 30 MG PO TABS: 1.0000 | ORAL_TABLET | Freq: Two times a day (BID) | ORAL | 0 refills | Status: DC

## 2019-01-22 MED FILL — AMPHETAMINE-DEXTROAMPHETAMI: 30 | 30 days supply | Qty: 60 | Fill #0

## 2019-01-23 ENCOUNTER — Encounter: Payer: Self-pay | Admitting: Internal Medicine

## 2019-01-23 ENCOUNTER — Other Ambulatory Visit (INDEPENDENT_AMBULATORY_CARE_PROVIDER_SITE_OTHER): Payer: Self-pay

## 2019-01-23 ENCOUNTER — Ambulatory Visit (INDEPENDENT_AMBULATORY_CARE_PROVIDER_SITE_OTHER): Payer: Self-pay | Admitting: Internal Medicine

## 2019-01-23 ENCOUNTER — Other Ambulatory Visit: Payer: Self-pay

## 2019-01-23 VITALS — BP 118/80 | HR 90 | Ht 64.25 in | Wt 116.0 lb

## 2019-01-23 DIAGNOSIS — R7989 Other specified abnormal findings of blood chemistry: Secondary | ICD-10-CM | POA: Diagnosis not present

## 2019-01-23 DIAGNOSIS — K900 Celiac disease: Secondary | ICD-10-CM | POA: Insufficient documentation

## 2019-01-23 LAB — T3, FREE: T3, Free: 3.3 pg/mL (ref 2.3–4.2)

## 2019-01-23 LAB — TSH: TSH: 2.74 u[IU]/mL (ref 0.35–4.50)

## 2019-01-23 LAB — T4, FREE: Free T4: 0.81 ng/dL (ref 0.60–1.60)

## 2019-01-23 NOTE — Progress Notes (Signed)
Patient ID: Michaela Bryant, female   DOB: Jun 02, 1975, 44 y.o.   MRN: [id number]   This visit occurred during the SARS-CoV-2 public health emergency.  Safety protocols were in place, including screening questions prior to the visit, additional usage of staff PPE, and extensive cleaning of exam room while observing appropriate contact time as indicated for disinfecting solutions.   HPI  Michaela Bryant is a 44 y.o.-year-old female, referred by her PCP, [place], Berna Spare, NP, for management of Hashimoto's hypothyroidism.  Pt. has been dx with hypothyroidism in 2012 >> not on on Levothyroxine as her TFTs normalized afterwards. However, a TSH returned again elevated last month.  I reviewed pt's thyroid tests: Lab Results  Component Value Date   TSH 7.320 (H) 12/26/2018   TSH 2.880 12/25/2017   TSH 5.964 (H) 08/04/2010   FREET4 1.12 12/26/2018  No results found for: T3FREE   Component     Latest Ref Rng & Units 12/26/2018  Triiodothyronine (T3)     71 - 180 ng/dL 202 (H)   Antithyroid antibodies: No results found for: THGAB No components found for: TPOAB  Pt describes: - + fatigue - + hair breaking - no weight gain - no heat or cold intolerance - no anxiety or depression - no constipation - no dry skin  Pt denies feeling nodules in neck, hoarseness, dysphagia/odynophagia, SOB with lying down.  However, she does describe occasional episodes of neck pressure, none currently.  She actually had a recent thyroid ultrasound (01/14/2019): thyroid appeared moderately heterogeneous and potentially hyperemic but it was normal in size and without nodules  She has + FH of thyroid disorders in: MGM - Graves ds, M aunt - goiter. No FH of thyroid cancer.  No h/o radiation tx to head or neck. No recent use of iodine supplements.  She also has celiac ds. She has a h/o R heel cellulitis and sepsis in 2018. Pt. also has a history of ADHD-on Adderall. She does use 3 times a  week.  ROS: Constitutional: no weight gain/loss, + fatigue, no subjective hyperthermia/hypothermia Eyes: no blurry vision, no xerophthalmia ENT: no sore throat, + see HPI Cardiovascular: no CP/SOB/palpitations/leg swelling Respiratory: no cough/SOB Gastrointestinal: no N/V/D/C Musculoskeletal: no muscle/joint aches Skin: no rashes, no hair loss, but they are breaking Neurological: no tremors/numbness/tingling/dizziness Psychiatric: no depression/anxiety  Past Medical History:  Diagnosis Date  . ADD (attention deficit disorder)   . Celiac disease   . Depression   . Pinched vertebral nerve    No past surgical history on file. Social History   Socioeconomic History  . Marital status: Legally Separated    Spouse name: Not on file  . Number of children: 2  . Years of education: Not on file  . Highest education level: Not on file  Occupational History  . Occupation: Secondary school teacher  Tobacco Use  . Smoking status: Never Smoker  . Smokeless tobacco: Never Used  Substance and Sexual Activity  . Alcohol use: Yes    Alcohol/week: 3.0 standard drinks    Types: 3 Glasses of wine per week  . Drug use: No  . Sexual activity: Not Currently  Other Topics Concern  . Not on file  Social History Narrative  . Not on file   Social Determinants of Health   Financial Resource Strain:   . Difficulty of Paying Living Expenses: Not on file  Food Insecurity:   . Worried About Charity fundraiser in the Last Year: Not on file  .  Ran Out of Food in the Last Year: Not on file  Transportation Needs:   . Lack of Transportation (Medical): Not on file  . Lack of Transportation (Non-Medical): Not on file  Physical Activity:   . Days of Exercise per Week: Not on file  . Minutes of Exercise per Session: Not on file  Stress:   . Feeling of Stress : Not on file  Social Connections:   . Frequency of Communication with Friends and Family: Not on file  . Frequency of Social Gatherings with  Friends and Family: Not on file  . Attends Religious Services: Not on file  . Active Member of Clubs or Organizations: Not on file  . Attends Archivist Meetings: Not on file  . Marital Status: Not on file  Intimate Partner Violence:   . Fear of Current or Ex-Partner: Not on file  . Emotionally Abused: Not on file  . Physically Abused: Not on file  . Sexually Abused: Not on file   Current Outpatient Medications on File Prior to Visit  Medication Sig Dispense Refill  . amphetamine-dextroamphetamine (ADDERALL) 10 MG tablet 1 tablet by mouth daily 30 tablet 0  . amphetamine-dextroamphetamine (ADDERALL) 30 MG tablet Take 1 tablet by mouth 2 (two) times daily. 60 tablet 0  . hydrochlorothiazide (MICROZIDE) 12.5 MG capsule TAKE 1 CAPSULE BY MOUTH DAILY IN THE MORNING AS NEEDED FOR SWELLING/FLUID  0  . Multiple Vitamin (MULTIVITAMIN) capsule Take 1 capsule by mouth daily.    . ondansetron (ZOFRAN-ODT) 8 MG disintegrating tablet Take 1 tablet (8 mg total) by mouth every 8 (eight) hours as needed for nausea or vomiting. 20 tablet 1  . traZODone (DESYREL) 50 MG tablet Take 0.5-1 tablets (25-50 mg total) by mouth at bedtime as needed for sleep. 30 tablet 3   No current facility-administered medications on file prior to visit.   Allergies  Allergen Reactions  . Gluten Meal Nausea Only   Family History  Problem Relation Age of Onset  . Alcohol abuse Mother   . Cancer Mother        breast  . Alcohol abuse Maternal Aunt   . Alcohol abuse Maternal Uncle   . Diabetes Maternal Grandmother   . Hypertension Maternal Grandmother   . Alcohol abuse Maternal Grandfather   . Hypertension Maternal Grandfather   . Stroke Paternal Grandfather    PE: BP 118/80   Pulse 90   Ht 5' 4.25" (1.632 m)   Wt 116 lb (52.6 kg)   LMP 01/07/2019   SpO2 98%   BMI 19.76 kg/m  Wt Readings from Last 3 Encounters:  01/23/19 116 lb (52.6 kg)  01/01/19 114 lb 11.2 oz (52 kg)  09/18/18 116 lb 14.4 oz (53  kg)   Constitutional: overweight, in NAD Eyes: PERRLA, EOMI, no exophthalmos ENT: moist mucous membranes, no thyromegaly, no cervical lymphadenopathy Cardiovascular: Tachycardia, RR, No MRG Respiratory: CTA B Gastrointestinal: abdomen soft, NT, ND, BS+ Musculoskeletal: no deformities, strength intact in all 4 Skin: moist, warm, no rashes Neurological: no tremor with outstretched hands, DTR normal in all 4  ASSESSMENT: 1. Hypothyroidism  PLAN:  1. Patient with history of an elevated TSH in the past followed by normal TFTs afterwards but with another elevated TSH recently. She also had a slightly elevated total T3. - she appears euthyroid but does complain of fatigue and hair breakage.  - she does not appear to have a goiter, thyroid nodules, or neck compression symptoms on clinical exam today on review  of her thyroid ultrasound. Her thyroid gland appears heterogeneous, which may be indicative of Hashimoto's thyroiditis.  Pt does not have a thyroid cancer family history or a personal history of RxTx to head/neck, so she is not at high risk for [facility]. - we had a long discussion about  Hashimoto thyroiditis in case she does have is. I explained that this is an autoimmune disorder, in which she develops antibodies against her own thyroid. The antibodies bind to the thyroid tissue and cause inflammation, and, eventually, destruction of the gland and hypothyroidism. We don't know how long this process can be, it can last from months to years. Fluctuation of the free thyroid hormones is not unusual especially at the beginning of the disease course. - I also explained that thyroid enlargement especially at the beginning of her Hashimoto thyroiditis course is not uncommon, and it has a waxing and waning character.  She did have some pressure in her neck possibly related to inflammation - We discussed about treatment for Hashimoto thyroiditis, which is actually limited to thyroid hormones in case her TFTs  are abnormal. Supplements like selenium has been tried with various results, some showing improvement in the TPO antibodies. However, there are no randomized controlled trials of this are consistent results between trials. We also discussed about ways to improve her immune system (relaxation, diet, exercise, sleep) to reduce the Ab titer and, subsequently, the thyroid inflammation. - We discussed about subclinical hypothyroidism in general and also about indications for treatment:  Desire for pregnancy  TSH higher than 10  TSH lower than 10 with signs or symptoms consistent with hypothyroidism - After the results return today, if we need to start levothyroxine, we discussed longer to take this correctly: every day, with water, at least 30 minutes before breakfast, separated by at least 4 hours from: - acid reflux medications - calcium - iron - multivitamins - I will see her back in 4 months, but likely sooner for labs   Component     Latest Ref Rng & Units 01/23/2019  TSH     0.35 - 4.50 uIU/mL 2.74  T4,Free(Direct)     0.60 - 1.60 ng/dL 0.81  Thyroglobulin Ab     < or = 1 IU/mL 2 (H)  Thyroperoxidase Ab SerPl-aCnc     <9 IU/mL 138 (H)  Triiodothyronine,Free,Serum     2.3 - 4.2 pg/mL 3.3  Labs point towards euthyroid Hashimoto's thyroiditis.  I would suggest to start selenium 200 mcg daily and we will recheck the above tests when she returns in 4 months.  Philemon Kingdom, MD PhD [facility]

## 2019-01-23 NOTE — Patient Instructions (Signed)
Please stop at the lab.  Please come back for a follow-up appointment in 4 months.  Hypothyroidism  Hypothyroidism is when the thyroid gland does not make enough of certain hormones (it is underactive). The thyroid gland is a small gland located in the lower front part of the neck, just in front of the windpipe (trachea). This gland makes hormones that help control how the body uses food for energy (metabolism) as well as how the heart and brain function. These hormones also play a role in keeping your bones strong. When the thyroid is underactive, it produces too little of the hormones thyroxine (T4) and triiodothyronine (T3). What are the causes? This condition may be caused by:  Hashimoto's disease. This is a disease in which the body's disease-fighting system (immune system) attacks the thyroid gland. This is the most common cause.  Viral infections.  Pregnancy.  Certain medicines.  Birth defects.  Past radiation treatments to the head or neck for cancer.  Past treatment with radioactive iodine.  Past exposure to radiation in the environment.  Past surgical removal of part or all of the thyroid.  Problems with a gland in the center of the brain (pituitary gland).  Lack of enough iodine in the diet. What increases the risk? You are more likely to develop this condition if:  You are female.  You have a family history of thyroid conditions.  You use a medicine called lithium.  You take medicines that affect the immune system (immunosuppressants). What are the signs or symptoms? Symptoms of this condition include:  Feeling as though you have no energy (lethargy).  Not being able to tolerate cold.  Weight gain that is not explained by a change in diet or exercise habits.  Lack of appetite.  Dry skin.  Coarse hair.  Menstrual irregularity.  Slowing of thought processes.  Constipation.  Sadness or depression. How is this diagnosed? This condition may be  diagnosed based on:  Your symptoms, your medical history, and a physical exam.  Blood tests. You may also have imaging tests, such as an ultrasound or MRI. How is this treated? This condition is treated with medicine that replaces the thyroid hormones that your body does not make. After you begin treatment, it may take several weeks for symptoms to go away. Follow these instructions at home:  Take over-the-counter and prescription medicines only as told by your health care provider.  If you start taking any new medicines, tell your health care provider.  Keep all follow-up visits as told by your health care provider. This is important. ? As your condition improves, your dosage of thyroid hormone medicine may change. ? You will need to have blood tests regularly so that your health care provider can monitor your condition. Contact a health care provider if:  Your symptoms do not get better with treatment.  You are taking thyroid replacement medicine and you: ? Sweat a lot. ? Have tremors. ? Feel anxious. ? Lose weight rapidly. ? Cannot tolerate heat. ? Have emotional swings. ? Have diarrhea. ? Feel weak. Get help right away if you have:  Chest pain.  An irregular heartbeat.  A rapid heartbeat.  Difficulty breathing. Summary  Hypothyroidism is when the thyroid gland does not make enough of certain hormones (it is underactive).  When the thyroid is underactive, it produces too little of the hormones thyroxine (T4) and triiodothyronine (T3).  The most common cause is Hashimoto's disease, a disease in which the body's disease-fighting system (immune system)  attacks the thyroid gland. The condition can also be caused by viral infections, medicine, pregnancy, or past radiation treatment to the head or neck.  Symptoms may include weight gain, dry skin, constipation, feeling as though you do not have energy, and not being able to tolerate cold.  This condition is treated with  medicine to replace the thyroid hormones that your body does not make. This information is not intended to replace advice given to you by your health care provider. Make sure you discuss any questions you have with your health care provider. Document Revised: 12/02/2016 Document Reviewed: 11/30/2016 Elsevier Patient Education  2020 [employer].

## 2019-01-24 LAB — THYROID PEROXIDASE ANTIBODY: Thyroperoxidase Ab SerPl-aCnc: 138 IU/mL — ABNORMAL HIGH (ref <9)

## 2019-01-24 LAB — THYROGLOBULIN ANTIBODY: Thyroglobulin Ab: 2 IU/mL — ABNORMAL HIGH (ref <=1)

## 2019-01-28 IMAGING — MR MR ANKLE*R* WO/W CM
7 of 18 series · 17 of 40 positions shown · IV contrast (multihance)
Comparison: None.

CLINICAL DATA: Prior small toe fracture, with new onset of
swelling, pain, and erythema extending from the toes up into the
ankle and distal calf.

EXAM:
MRI OF THE RIGHT ANKLE WITHOUT AND WITH CONTRAST; MRI OF THE RIGHT
FOREFOOT WITHOUT AND WITH CONTRAST
TECHNIQUE: Multiplanar, multisequence MR imaging of the ankle was performed
before and after the administration of intravenous contrast.
CONTRAST:  10mL MULTIHANCE GADOBENATE DIMEGLUMINE 529 MG/ML IV SOLN

[Series 4: T1 · coronal · 4.0mm · 0.27mm/px · 3 of 45 slices shown (1 of 3)]
[im 1/45]
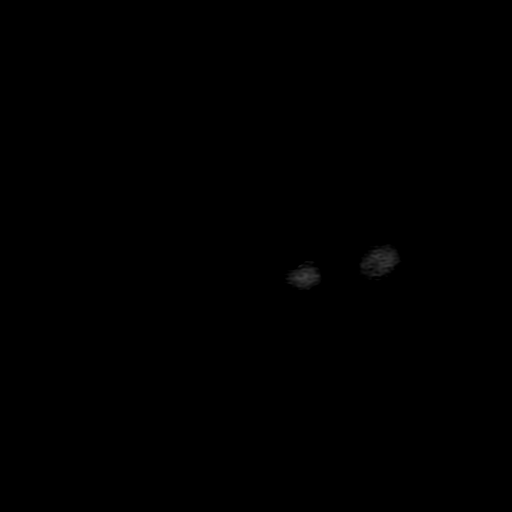
[im 23/45]
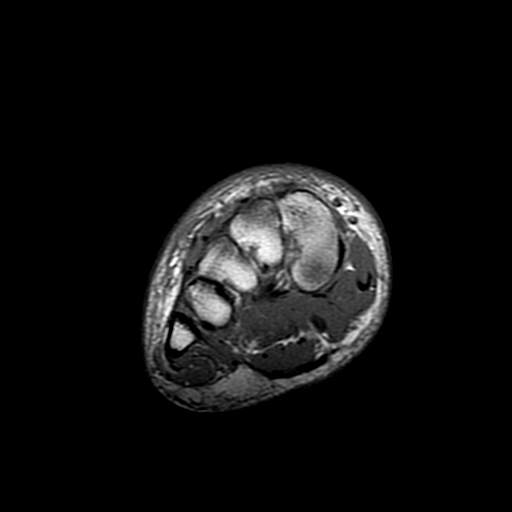
[im 45/45]
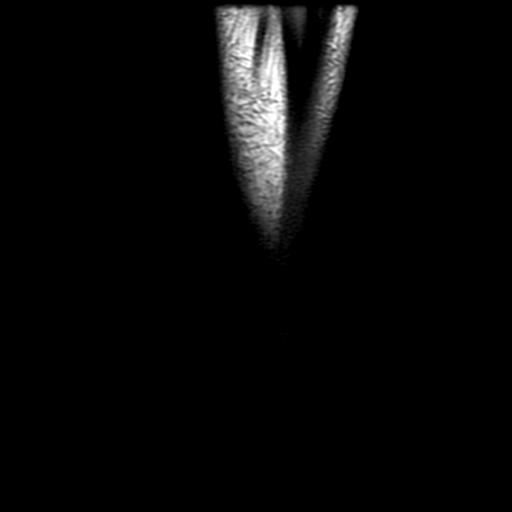

[Series 6: T1 · axial · 4.0mm · 0.47mm/px · z∈[-118,-35]mm · 2 of 20 slices shown (2 of 3)]
[im 1/20]
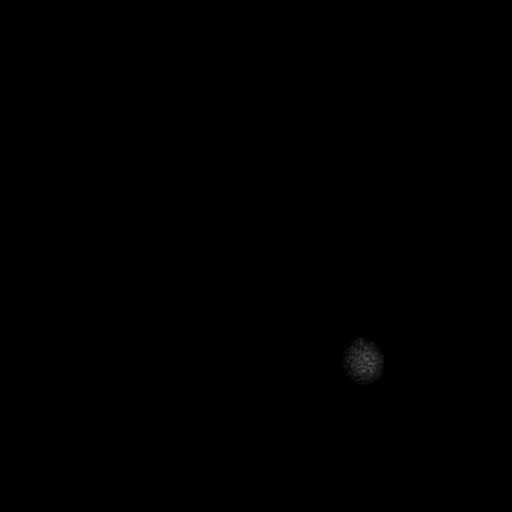
[im 20/20]
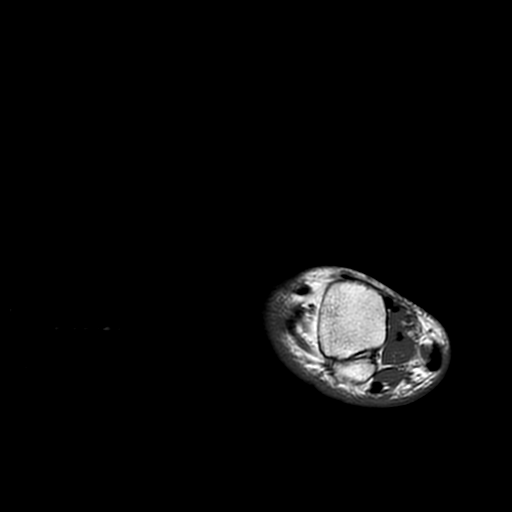

[Series 9: PD fat-sat · axial · 4.0mm · 0.29mm/px · z∈[-74,+75]mm · 3 of 31 slices shown]
[im 1/31]
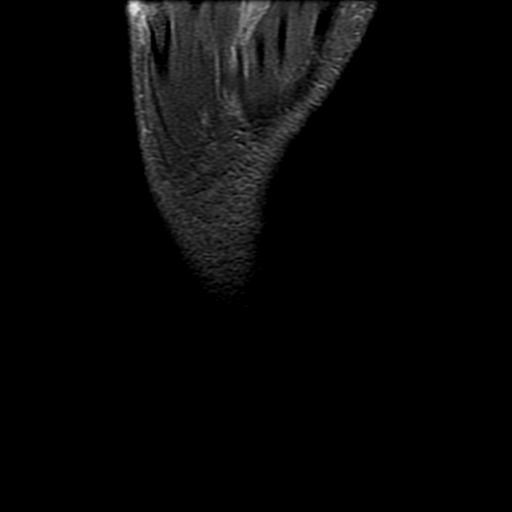
[im 16/31]
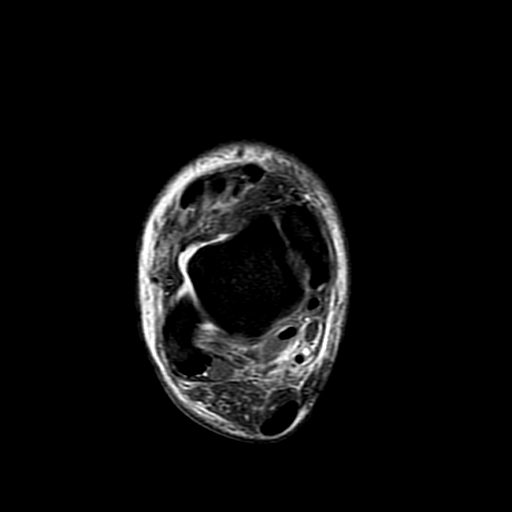
[im 31/31]
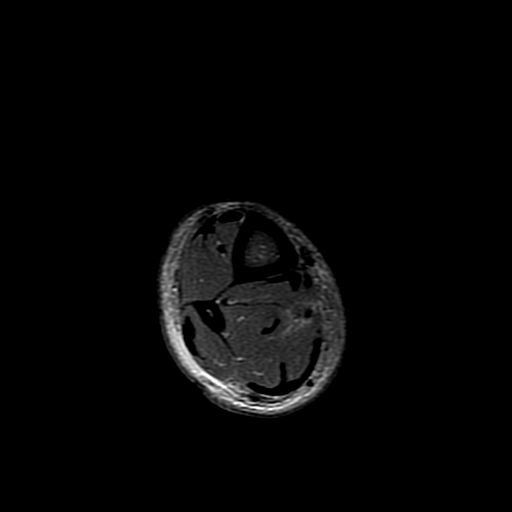

[Series 10: T2 fat-sat · axial · 4.0mm · 0.29mm/px · z∈[-74,+75]mm · 3 of 31 slices shown (1 of 3)]
[im 1/31]
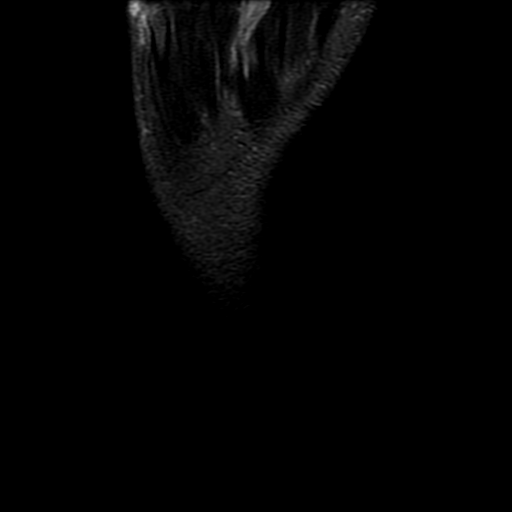
[im 16/31]
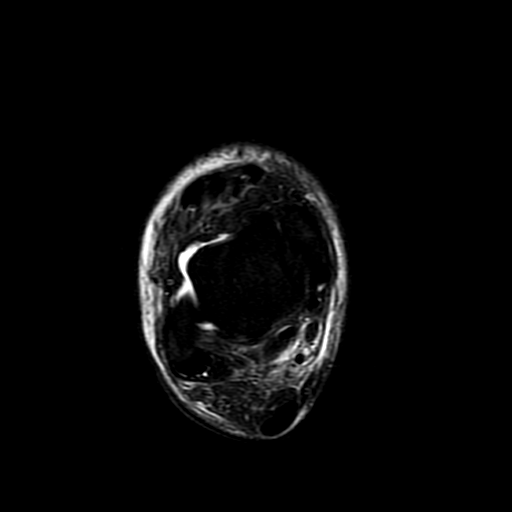
[im 31/31]
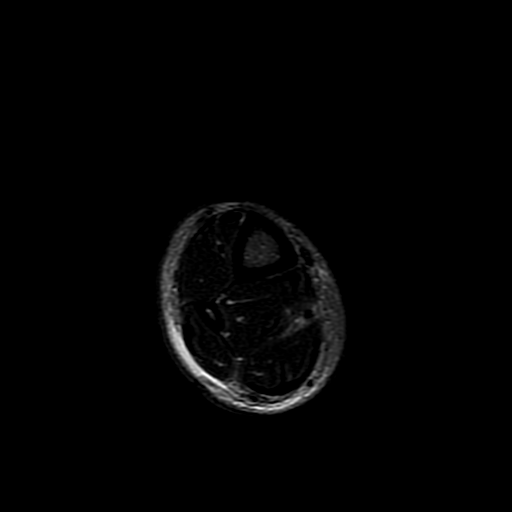

[Series 11: T1 · axial · 4.0mm · 0.29mm/px · z∈[-74,+75]mm · 3 of 31 slices shown (3 of 3)]
[im 1/31]
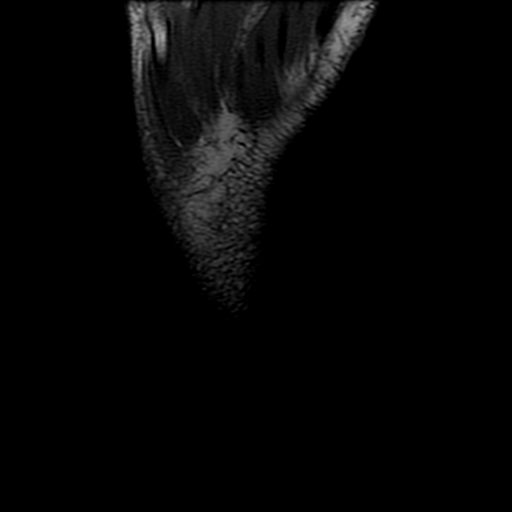
[im 16/31]
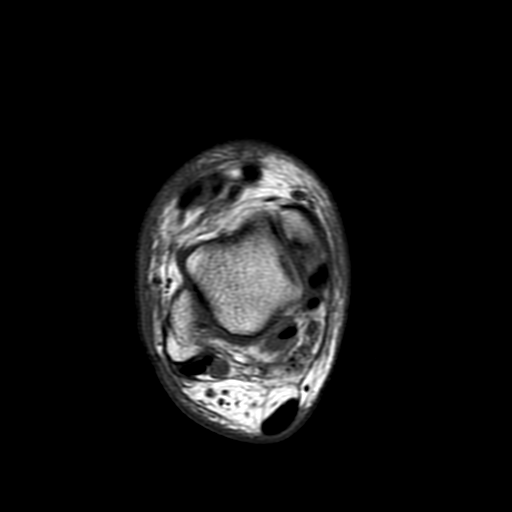
[im 31/31]
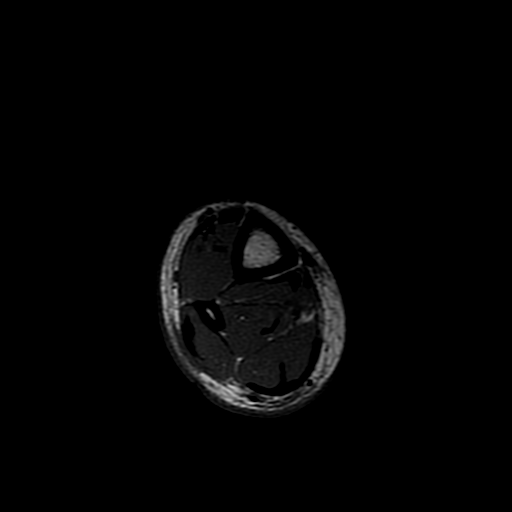

[Series 12: T2 fat-sat · oblique · 4.0mm · 0.29mm/px · 2 of 24 slices shown (2 of 3)]
[im 1/24]
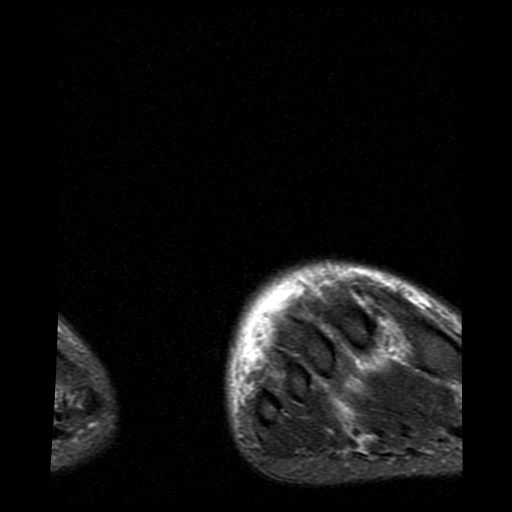
[im 24/24]
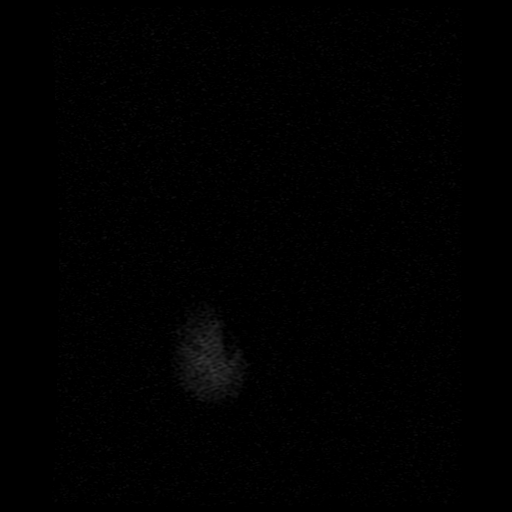

[Series 14: T2 fat-sat · sagittal · 4.0mm · 0.31mm/px · 1 of 15 slices shown (3 of 3)]
[im 1/15]
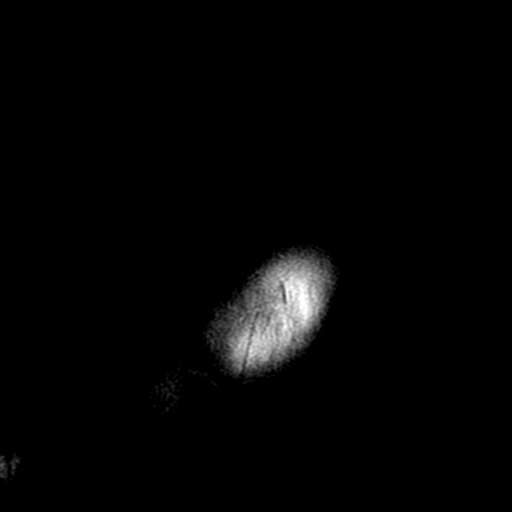

[17 of 40 positions shown; findings below may reference images not displayed]

FINDINGS: MRI FOREFOOT:

This a known fracture the proximal phalanx of the small toe with
associated surrounding edema for example on image [DATE]. There may be
some low-level edema in the head of the fifth metatarsal on image
[DATE] is well, but given the recent trauma in this vicinity I am
skeptical of osteomyelitis. Conventional radiography has reportedly
been performed at the outside orthopedist office, and a review to
confirm that there is not a subtle fracture of the distal fifth
metatarsal would be suggested, but in general I am skeptical of a
fifth metatarsal fracture.

The remaining toes appear unremarkable. There is no significant
joint effusion. Lisfranc ligament intact. The metatarsals and first
digit sesamoids appear otherwise normal. No significant abnormal
osseous edema in the midfoot.

There is subcutaneous edema tracking along the foot dorsally and
also with some extension into the second through fifth toes. Subtle
edema also tracks between the adductor hallucis oblique head in the
metatarsals as shown on image [DATE], with only minimal associated
enhancement in this vicinity on image [DATE] well be reactive.
There is no drainable abscess or gas tracking in the soft tissues.

MRI HINDFOOT/ANKLE:

TENDONS

Peroneal: Unremarkable

Posteromedial: Mild distal tibialis posterior tenosynovitis.

Anterior: Unremarkable

Achilles: Unremarkable

Plantar Fascia: Unremarkable

LIGAMENTS

Lateral: Unremarkable

Medial: Unremarkable

CARTILAGE

Ankle Joint: Unremarkable

Subtalar Joints/Sinus Tarsi: Unremarkable

Bones: Unremarkable

Other: Subcutaneous edema and enhancement tracking along the dorsum
of the foot and around the lateral ankle and posterior ankle, and to
a lesser extent the medial ankle, and into the calf.
IMPRESSION: IMPRESSION
1. The abnormal subcutaneous edema and enhancement tracking in the
lateral four toes, in the dorsum of the forefoot, and tracking
around the ankle and into the calf, suspicious for cellulitis.
Reflex sympathetic dystrophy would be a less likely differential
diagnostic consideration.
2. There is some trace enhancement and edema along the plantar
musculature of the foot, specifically between the adductor hallucis
oblique head and the metatarsals/interosseous muscles. This is most
likely reactive and less likely to represent a low-grade fasciitis.
No gas is present in the soft tissues.
3. No compelling findings of osteomyelitis. No drainable abscess.
Abnormal edema in the proximal phalanx small toe is attributable to
the known fracture. There is some subtle edema in the head of the
fifth metatarsal which also could be posttraumatic. Node joint
effusion to suggest septic joint.
4. Mild distal tibialis posterior tenosynovitis.

## 2019-01-30 DIAGNOSIS — Z20828 Contact with and (suspected) exposure to other viral communicable diseases: Secondary | ICD-10-CM | POA: Diagnosis not present

## 2019-02-01 DIAGNOSIS — R05 Cough: Secondary | ICD-10-CM | POA: Diagnosis not present

## 2019-02-01 DIAGNOSIS — Z03818 Encounter for observation for suspected exposure to other biological agents ruled out: Secondary | ICD-10-CM | POA: Diagnosis not present

## 2019-02-01 DIAGNOSIS — M791 Myalgia, unspecified site: Secondary | ICD-10-CM | POA: Diagnosis not present

## 2019-02-01 DIAGNOSIS — Z20828 Contact with and (suspected) exposure to other viral communicable diseases: Secondary | ICD-10-CM | POA: Diagnosis not present

## 2019-02-01 DIAGNOSIS — J029 Acute pharyngitis, unspecified: Secondary | ICD-10-CM | POA: Diagnosis not present

## 2019-02-01 DIAGNOSIS — R5383 Other fatigue: Secondary | ICD-10-CM | POA: Diagnosis not present

## 2019-02-01 DIAGNOSIS — R509 Fever, unspecified: Secondary | ICD-10-CM | POA: Diagnosis not present

## 2019-02-04 ENCOUNTER — Other Ambulatory Visit: Payer: Self-pay | Admitting: Adult Health

## 2019-02-04 MED ORDER — HYDROCHLOROTHIAZIDE 12.5 MG PO CAPS: ORAL_CAPSULE | ORAL | 0 refills | Status: DC

## 2019-02-04 MED ORDER — AMPHETAMINE-DEXTROAMPHETAMINE 10 MG PO TABS: ORAL_TABLET | ORAL | 0 refills | Status: DC

## 2019-02-04 MED FILL — HYDROCHLOROTHIAZIDE 12.5 MG: 12.5 | 30 days supply | Qty: 30 | Fill #0

## 2019-02-05 MED FILL — AMPHETAMINE-DEXTROAMPHETAMI: 10 | 30 days supply | Qty: 30 | Fill #0

## 2019-02-08 DIAGNOSIS — F900 Attention-deficit hyperactivity disorder, predominantly inattentive type: Secondary | ICD-10-CM | POA: Diagnosis not present

## 2019-02-08 DIAGNOSIS — R609 Edema, unspecified: Secondary | ICD-10-CM | POA: Diagnosis not present

## 2019-02-21 MED FILL — AMPHETAMINE-DEXTROAMPHETAMI: 30 | 30 days supply | Qty: 60 | Fill #0

## 2019-03-04 MED FILL — AMPHETAMINE-DEXTROAMPHETAMI: 10 | 30 days supply | Qty: 30 | Fill #0

## 2019-03-21 MED FILL — AMPHETAMINE-DEXTROAMPHETAMI: 30 | 30 days supply | Qty: 60 | Fill #0

## 2019-03-29 DIAGNOSIS — Z319 Encounter for procreative management, unspecified: Secondary | ICD-10-CM | POA: Diagnosis not present

## 2019-04-03 MED FILL — AMPHETAMINE-DEXTROAMPHETAMI: 10 | 30 days supply | Qty: 30 | Fill #0

## 2019-04-20 MED FILL — AMPHETAMINE-DEXTROAMPHETAMI: 30 | 30 days supply | Qty: 60 | Fill #0

## 2019-05-01 MED FILL — AMPHETAMINE-DEXTROAMPHETAMI: 10 | 30 days supply | Qty: 30 | Fill #0

## 2019-05-07 ENCOUNTER — Encounter: Payer: Self-pay | Admitting: Internal Medicine

## 2019-05-10 ENCOUNTER — Encounter: Payer: Self-pay | Admitting: Internal Medicine

## 2019-05-10 ENCOUNTER — Ambulatory Visit (INDEPENDENT_AMBULATORY_CARE_PROVIDER_SITE_OTHER): Payer: Self-pay | Admitting: Internal Medicine

## 2019-05-10 ENCOUNTER — Other Ambulatory Visit: Payer: Self-pay

## 2019-05-10 VITALS — BP 130/70 | HR 108 | Ht 64.25 in | Wt 123.0 lb

## 2019-05-10 DIAGNOSIS — E063 Autoimmune thyroiditis: Secondary | ICD-10-CM

## 2019-05-10 NOTE — Patient Instructions (Signed)
Please stop at the lab.  Please come back for a follow-up appointment in 4 months.

## 2019-05-10 NOTE — Progress Notes (Signed)
Patient ID: Michaela Bryant, female   DOB: 1975/12/15, 44 y.o.   MRN: [id number]   This visit occurred during the SARS-CoV-2 public health emergency.  Safety protocols were in place, including screening questions prior to the visit, additional usage of staff PPE, and extensive cleaning of exam room while observing appropriate contact time as indicated for disinfecting solutions.   HPI  Michaela Bryant is a 44 y.o.-year-old female, returning for follow-up for Hashimoto's thyroiditis.  Last visit 4 months ago.  She was diagnosed with hypothyroidism in 2012.  Her TFTs normalized afterwards so she was not started on levothyroxine.  They were again elevated before our last visit, in 12/2018.  However, at last visit, all TFTs were normal.  Patient scheduled the appointment earlier, because she started to feel poorly: More fatigued, gained weight, decreased interest in exercising, in the last month.  She describes that the symptoms started after Easter, possibly after she ate some gluten.  She also had an episode of anterior neck pressure (which has resolved), so she feels that the symptoms may be related to her thyroid.  I reviewed patient's TFTs: Lab Results  Component Value Date   TSH 2.74 01/23/2019   TSH 7.320 (H) 12/26/2018   TSH 2.880 12/25/2017   TSH 5.964 (H) 08/04/2010   FREET4 0.81 01/23/2019   FREET4 1.12 12/26/2018   T3FREE 3.3 01/23/2019   Lab Results  Component Value Date   T3FREE 3.3 01/23/2019    Her antithyroid antibodies were positive pointing towards Hashimoto's thyroiditis: Component     Latest Ref Rng & Units 01/23/2019  Thyroglobulin Ab     < or = 1 IU/mL 2 (H)  Thyroperoxidase Ab SerPl-aCnc     <9 IU/mL 138 (H)   At that point, I advised her to start selenium 200 mcg daily.  She continues on this.  She describes: - + Fatigue - + Weight gain - no motivation to exercise - no cold intolerance - no constipation - no hair loss but hair breakage  Pt denies: -  feeling nodules in neck - hoarseness - dysphagia - choking - SOB with lying down She did have occasional episodes of neck pressure in the past and again in the last month.  This resolved.  Thyroid ultrasound (01/14/2019): thyroid appeared moderately heterogeneous and potentially hyperemic but it was normal in size and without nodules  She has + FH of thyroid disorders in: MGM - Graves ds, M aunt - goiter. No FH of thyroid cancer. No h/o radiation tx to head or neck.  No seaweed or kelp. No recent contrast studies. No herbal supplements. No Biotin use. No recent steroids use.   She also has a history of celiac disease.  She has a h/o R heel cellulitis and sepsis in 2018. Pt. also has a history of ADHD-on Adderall. She does use 3 times a week.  ROS: Constitutional: + Weight gain/no weight loss, + fatigue, no subjective hyperthermia, no subjective hypothermia Eyes: no blurry vision, no xerophthalmia ENT: no sore throat, + see HPI Cardiovascular: no CP/no SOB/no palpitations/no leg swelling Respiratory: no cough/no SOB/no wheezing Gastrointestinal: no N/no V/no D/no C/no acid reflux Musculoskeletal: no muscle aches/no joint aches Skin: no rashes, + hair breakage Neurological: no tremors/no numbness/no tingling/no dizziness  I reviewed pt's medications, allergies, PMH, social hx, family hx, and changes were documented in the history of present illness. Otherwise, unchanged from my initial visit note.  Past Medical History:  Diagnosis Date  . ADD (attention deficit  disorder)   . Celiac disease   . Depression   . Pinched vertebral nerve    No past surgical history on file. Social History   Socioeconomic History  . Marital status: Legally Separated    Spouse name: Not on file  . Number of children: 2  . Years of education: Not on file  . Highest education level: Not on file  Occupational History  . Occupation: Secondary school teacher  Tobacco Use  . Smoking status: Never Smoker  .  Smokeless tobacco: Never Used  Substance and Sexual Activity  . Alcohol use: Yes    Alcohol/week: 3.0 standard drinks    Types: 3 Glasses of wine per week  . Drug use: No  . Sexual activity: Not Currently  Other Topics Concern  . Not on file  Social History Narrative  . Not on file   Social Determinants of Health   Financial Resource Strain:   . Difficulty of Paying Living Expenses:   Food Insecurity:   . Worried About Charity fundraiser in the Last Year:   . Arboriculturist in the Last Year:   Transportation Needs:   . Film/video editor (Medical):   Marland Kitchen Lack of Transportation (Non-Medical):   Physical Activity:   . Days of Exercise per Week:   . Minutes of Exercise per Session:   Stress:   . Feeling of Stress :   Social Connections:   . Frequency of Communication with Friends and Family:   . Frequency of Social Gatherings with Friends and Family:   . Attends Religious Services:   . Active Member of Clubs or Organizations:   . Attends Archivist Meetings:   Marland Kitchen Marital Status:   Intimate Partner Violence:   . Fear of Current or Ex-Partner:   . Emotionally Abused:   Marland Kitchen Physically Abused:   . Sexually Abused:    Current Outpatient Medications on File Prior to Visit  Medication Sig Dispense Refill  . amphetamine-dextroamphetamine (ADDERALL) 10 MG tablet 1 tablet by mouth daily 30 tablet 0  . amphetamine-dextroamphetamine (ADDERALL) 30 MG tablet Take 1 tablet by mouth 2 (two) times daily. 60 tablet 0  . hydrochlorothiazide (MICROZIDE) 12.5 MG capsule 30TAKE 1 CAPSULE BY MOUTH DAILY IN THE MORNING AS NEEDED FOR SWELLING/FLUID 30 capsule 0  . Multiple Vitamin (MULTIVITAMIN) capsule Take 1 capsule by mouth daily.    . ondansetron (ZOFRAN-ODT) 8 MG disintegrating tablet Take 1 tablet (8 mg total) by mouth every 8 (eight) hours as needed for nausea or vomiting. 20 tablet 1  . traZODone (DESYREL) 50 MG tablet Take 0.5-1 tablets (25-50 mg total) by mouth at bedtime as  needed for sleep. 30 tablet 3   No current facility-administered medications on file prior to visit.   Allergies  Allergen Reactions  . Gluten Meal Nausea Only   Family History  Problem Relation Age of Onset  . Alcohol abuse Mother   . Cancer Mother        breast  . Alcohol abuse Maternal Aunt   . Alcohol abuse Maternal Uncle   . Diabetes Maternal Grandmother   . Hypertension Maternal Grandmother   . Alcohol abuse Maternal Grandfather   . Hypertension Maternal Grandfather   . Stroke Paternal Grandfather    PE: BP 130/70   Pulse (!) 108   Ht 5' 4.25" (1.632 m)   Wt 123 lb (55.8 kg)   SpO2 97%   BMI 20.95 kg/m  Wt Readings from Last 3 Encounters:  05/10/19 123 lb (55.8 kg)  01/23/19 116 lb (52.6 kg)  01/01/19 114 lb 11.2 oz (52 kg)   Constitutional: normal weight, in NAD Eyes: PERRLA, EOMI, no exophthalmos ENT: moist mucous membranes, no thyromegaly, no cervical lymphadenopathy Cardiovascular: tachycardia, RR, No MRG Respiratory: CTA B Gastrointestinal: abdomen soft, NT, ND, BS+ Musculoskeletal: no deformities, strength intact in all 4 Skin: moist, warm, no rashes Neurological: no tremor with outstretched hands, DTR normal in all 4  ASSESSMENT: 1.  Hashimoto's thyroiditis  PLAN:  1. Patient with history of elevated TSH in the past, followed by normal TFTs afterwards but with another elevated TSH at the end of last year.  She also had a slightly elevated total T3 at that time.  I first saw the patient in 01/2019 and at that time we checked her TSH, free T4, and free T3 and they were all normal.  However, her TPO and ATA antibodies were elevated, giving her a diagnosis of Hashimoto's thyroiditis.  I advised her at that time that no levothyroxine is needed for treatment, but we did start selenium in an effort to normalize her antibodies and reduce her thyroid inflammation. -At last visit we discussed about Hashimoto's thyroiditis is an autoimmune disease which can cause  inflammation of the thyroid and can lead to hypothyroidism.  At this visit we again discussed about this and the fact that treatment is limited to thyroid hormones usually in case her TFTs are abnormal. -We again discussed about subclinical hypothyroidism in general and about the indications for treatment:  Desire for pregnancy  TSH higher than 10  TSH lower than 10 with signs or symptoms consistent with hypothyroidism -At last visit she complained of fatigue but this was mild -At this visit, her fatigue increased, she also has lack of interest in exercise, and also.  She also has a weight gain of 7 pounds.  She feels that this may be related to her thyroid. -We will recheck her TFTs today along with her thyroid antibodies.  We discussed that we may stabilize the thyroid tests with starting a low dose of levothyroxine, if the TSH is high, or at least in the second upper half of the normal interval.  Otherwise, it is difficult to suggest treatment with levothyroxine, if the TSH is low in the normal range.  In this case, we discussed that she will need to improve her immune system and she is planning to start a keto diet.  She feels that this worked for her in the past and is something that she can follow.  I advised her that if she starts, not to do it for a long time since it may increase inflammation long-term. -After the results returned today, if we need to start levothyroxine, I advised her how to take this correctly - I will see her back in 4 months, but possibly sooner for labs  - Total time spent with the patient: 25 minutes, in obtaining medical information from the chart, reviewing her  previous labs, imaging evaluations, and treatments, reviewing her symptoms, counseling her about her condition (please see the discussed topics above), and developing a plan to further investigate and treat it.  Component     Latest Ref Rng & Units 05/10/2019  TSH     0.450 - 4.500 uIU/mL 1.710   T4,Free(Direct)     0.82 - 1.77 ng/dL 0.73 (L)  Thyroperoxidase Ab SerPl-aCnc     0 - 34 IU/mL 112 (H)  Triiodothyronine,Free,Serum     2.0 -  4.4 pg/mL 2.4  Thyroglobulin Antibody     0.0 - 0.9 IU/mL 2.3 (H)   TSH has improved, free T3 is normal, while the free T4 is slightly low.  TPO antibodies have improved while antithyroglobulin antibodies are slightly higher.  At this point, I would suggest to continue with the selenium.  I would not recommend to start levothyroxine quite now.  I would want to repeat her thyroid test in approximately 5-6 weeks, though.   Philemon Kingdom, MD PhD [facility]

## 2019-05-11 LAB — TSH: TSH: 1.71 u[IU]/mL (ref 0.450–4.500)

## 2019-05-11 LAB — T3, FREE: T3, Free: 2.4 pg/mL (ref 2.0–4.4)

## 2019-05-11 LAB — T4, FREE: Free T4: 0.73 ng/dL — ABNORMAL LOW (ref 0.82–1.77)

## 2019-05-11 LAB — THYROID PEROXIDASE ANTIBODY: Thyroperoxidase Ab SerPl-aCnc: 112 IU/mL — ABNORMAL HIGH (ref 0–34)

## 2019-05-11 LAB — THYROGLOBULIN ANTIBODY: Thyroglobulin Antibody: 2.3 IU/mL — ABNORMAL HIGH (ref 0.0–0.9)

## 2019-05-29 ENCOUNTER — Ambulatory Visit: Payer: Self-pay | Admitting: Internal Medicine

## 2019-07-12 MED FILL — AMPHETAMINE-DEXTROAMPHETAMI: 30 | 30 days supply | Qty: 60 | Fill #0

## 2019-07-12 MED FILL — DEXTROAMP-AMP 10 MG TAB: 10 | 30 days supply | Qty: 30 | Fill #0

## 2019-08-09 MED FILL — AMPHETAMINE SALTS 10 MG: 10 | 30 days supply | Qty: 30 | Fill #0

## 2019-08-09 MED FILL — AMPHETAMINE-DEXTROAMPHETAMI: 30 | 30 days supply | Qty: 60 | Fill #0

## 2019-08-28 DIAGNOSIS — R52 Pain, unspecified: Secondary | ICD-10-CM | POA: Diagnosis not present

## 2019-08-28 DIAGNOSIS — R42 Dizziness and giddiness: Secondary | ICD-10-CM | POA: Diagnosis not present

## 2019-08-28 DIAGNOSIS — R1084 Generalized abdominal pain: Secondary | ICD-10-CM | POA: Diagnosis not present

## 2019-09-06 MED FILL — AMPHETAMINE-DEXTROAMPHETAMI: 30 | 30 days supply | Qty: 60 | Fill #0

## 2019-09-06 MED FILL — AMPHETAMINE SALTS 10 MG: 10 | 30 days supply | Qty: 30 | Fill #0

## 2019-09-13 ENCOUNTER — Other Ambulatory Visit: Payer: Self-pay

## 2019-09-13 ENCOUNTER — Ambulatory Visit: Payer: Self-pay | Admitting: Internal Medicine

## 2019-09-13 ENCOUNTER — Encounter: Payer: Self-pay | Admitting: Internal Medicine

## 2019-09-13 VITALS — BP 118/70 | HR 76 | Ht 64.25 in | Wt 116.0 lb

## 2019-09-13 DIAGNOSIS — E063 Autoimmune thyroiditis: Secondary | ICD-10-CM

## 2019-09-13 NOTE — Patient Instructions (Addendum)
Please stop at the [facility] lab.  Continue Selenium 200 mcg daily.  Please come back for a follow-up appointment in 4-6 months.

## 2019-09-13 NOTE — Progress Notes (Signed)
Patient ID: Michaela Bryant, female   DOB: 11-14-1975, 44 y.o.   MRN: [id number]   This visit occurred during the SARS-CoV-2 public health emergency.  Safety protocols were in place, including screening questions prior to the visit, additional usage of staff PPE, and extensive cleaning of exam room while observing appropriate contact time as indicated for disinfecting solutions.   HPI  Michaela Bryant is a 44 y.o.-year-old female, returning for follow-up for Hashimoto's thyroiditis.  Last visit 4 months ago.  She was diagnosed with hypothyroidism in 2012.  Her TFTs normalized afterwards so she was not started on levothyroxine.  However, a TSH was again elevated in 12/2018, and normalized again afterwards.    At our last visit in 05/2019, she described that for the previous month she had to feel more fatigued, gained weight, decreased interest in exercising. No cold intolerance, constipation, hair loss but she had hair breakage.  Symptoms started after Easter, possibly after she ate gluten.  At that time, she also had an episode of anterior neck pressure so she felt that the symptoms may have been related to the thyroid.  We continued selenium but did not start levothyroxine at that time.  Discussed about improving diet.  She did  do so since last visit.  At this visit, she made that she is feeling almost back to normal.  She had some more stress at home so was not able to sleep very well, but otherwise, she feels much better.  Reviewed her TFTs: Lab Results  Component Value Date   TSH 1.710 05/10/2019   TSH 2.74 01/23/2019   TSH 7.320 (H) 12/26/2018   TSH 2.880 12/25/2017   TSH 5.964 (H) 08/04/2010   FREET4 0.73 (L) 05/10/2019   FREET4 0.81 01/23/2019   FREET4 1.12 12/26/2018   T3FREE 2.4 05/10/2019   T3FREE 3.3 01/23/2019   Lab Results  Component Value Date   T3FREE 2.4 05/10/2019   T3FREE 3.3 01/23/2019    Her antithyroid antibodies are elevated: Component     Latest Ref Rng & Units  05/10/2019  Thyroperoxidase Ab SerPl-aCnc     0 - 34 IU/mL 112 (H)  Thyroglobulin Antibody     0.0 - 0.9 IU/mL 2.3 (H)   Component     Latest Ref Rng & Units 01/23/2019  Thyroglobulin Ab     < or = 1 IU/mL 2 (H)  Thyroperoxidase Ab SerPl-aCnc     <9 IU/mL 138 (H)   We started selenium 200 mcg daily 01/2019.  Pt denies: - feeling nodules in neck - hoarseness - dysphagia - choking - SOB with lying down  She had occasional episodes of neck pressure in the past, resolved.  Thyroid ultrasound (01/14/2019): thyroid appeared moderately heterogeneous and potentially hyperemic but it was normal in size and without nodules  She has + FH of thyroid disorders in: MGM - Graves ds, M aunt - goiter. No FH of thyroid cancer. No h/o radiation tx to head or neck.  No seaweed or kelp. No recent contrast studies. No herbal supplements. No Biotin use. No recent steroids use.   He has a history of celiac disease. She has a h/o R heel cellulitis and sepsis in 2018. Pt. also has a history of ADHD-on Adderall. She exercises 3 times a week.  ROS: Constitutional: no weight gain/+ weight loss, no fatigue, no subjective hyperthermia, no subjective hypothermia Eyes: no blurry vision, no xerophthalmia ENT: no sore throat, + see HPI Cardiovascular: no CP/no SOB/no palpitations/no leg  swelling Respiratory: no cough/no SOB/no wheezing Gastrointestinal: no N/no V/no D/no C/no acid reflux Musculoskeletal: no muscle aches/no joint aches Skin: no rashes, + hair breaking Neurological: no tremors/no numbness/no tingling/no dizziness  I reviewed pt's medications, allergies, PMH, social hx, family hx, and changes were documented in the history of present illness. Otherwise, unchanged from my initial visit note.  Past Medical History:  Diagnosis Date  . ADD (attention deficit disorder)   . Celiac disease   . Depression   . Pinched vertebral nerve    No past surgical history on file. Social History    Socioeconomic History  . Marital status: Legally Separated    Spouse name: Not on file  . Number of children: 2  . Years of education: Not on file  . Highest education level: Not on file  Occupational History  . Occupation: Secondary school teacher  Tobacco Use  . Smoking status: Never Smoker  . Smokeless tobacco: Never Used  Vaping Use  . Vaping Use: Never used  Substance and Sexual Activity  . Alcohol use: Yes    Alcohol/week: 3.0 standard drinks    Types: 3 Glasses of wine per week  . Drug use: No  . Sexual activity: Not Currently  Other Topics Concern  . Not on file  Social History Narrative  . Not on file   Social Determinants of Health   Financial Resource Strain:   . Difficulty of Paying Living Expenses: Not on file  Food Insecurity:   . Worried About Charity fundraiser in the Last Year: Not on file  . Ran Out of Food in the Last Year: Not on file  Transportation Needs:   . Lack of Transportation (Medical): Not on file  . Lack of Transportation (Non-Medical): Not on file  Physical Activity:   . Days of Exercise per Week: Not on file  . Minutes of Exercise per Session: Not on file  Stress:   . Feeling of Stress : Not on file  Social Connections:   . Frequency of Communication with Friends and Family: Not on file  . Frequency of Social Gatherings with Friends and Family: Not on file  . Attends Religious Services: Not on file  . Active Member of Clubs or Organizations: Not on file  . Attends Archivist Meetings: Not on file  . Marital Status: Not on file  Intimate Partner Violence:   . Fear of Current or Ex-Partner: Not on file  . Emotionally Abused: Not on file  . Physically Abused: Not on file  . Sexually Abused: Not on file   Current Outpatient Medications on File Prior to Visit  Medication Sig Dispense Refill  . amphetamine-dextroamphetamine (ADDERALL) 10 MG tablet 1 tablet by mouth daily 30 tablet 0  . amphetamine-dextroamphetamine (ADDERALL) 30  MG tablet Take 1 tablet by mouth 2 (two) times daily. 60 tablet 0  . hydrochlorothiazide (MICROZIDE) 12.5 MG capsule 30TAKE 1 CAPSULE BY MOUTH DAILY IN THE MORNING AS NEEDED FOR SWELLING/FLUID 30 capsule 0  . Multiple Vitamin (MULTIVITAMIN) capsule Take 1 capsule by mouth daily.    . ondansetron (ZOFRAN-ODT) 8 MG disintegrating tablet Take 1 tablet (8 mg total) by mouth every 8 (eight) hours as needed for nausea or vomiting. 20 tablet 1  . traZODone (DESYREL) 50 MG tablet Take 0.5-1 tablets (25-50 mg total) by mouth at bedtime as needed for sleep. 30 tablet 3   No current facility-administered medications on file prior to visit.   Allergies  Allergen Reactions  .  Gluten Meal Nausea Only   Family History  Problem Relation Age of Onset  . Alcohol abuse Mother   . Cancer Mother        breast  . Alcohol abuse Maternal Aunt   . Alcohol abuse Maternal Uncle   . Diabetes Maternal Grandmother   . Hypertension Maternal Grandmother   . Alcohol abuse Maternal Grandfather   . Hypertension Maternal Grandfather   . Stroke Paternal Grandfather    PE: BP 118/70   Pulse 76   Ht 5' 4.25" (1.632 m)   Wt 116 lb (52.6 kg)   SpO2 98%   BMI 19.76 kg/m  Wt Readings from Last 3 Encounters:  09/13/19 116 lb (52.6 kg)  05/10/19 123 lb (55.8 kg)  01/23/19 116 lb (52.6 kg)   Constitutional: normal weight, in NAD Eyes: PERRLA, EOMI, no exophthalmos ENT: moist mucous membranes, no thyromegaly, no cervical lymphadenopathy Cardiovascular: RRR, No MRG Respiratory: CTA B Gastrointestinal: abdomen soft, NT, ND, BS+ Musculoskeletal: no deformities, strength intact in all 4 Skin: moist, warm, no rashes Neurological: no tremor with outstretched hands, DTR normal in all 4  ASSESSMENT: 1.  Hashimoto's thyroiditis  PLAN:  1. Patient with history of elevated TSH in the past, followed by normal TFTs but with another elevated TSH in 12/2018.  Her free T3 was normal throughout but free T4 was slightly low at  last visit.  She has fluctuating TPO and ATA antibodies, even on selenium, which we started in 01/2019.  We discussed that it has the potential to normalize in the antibodies and reduce her thyroid inflammation. -At last visit, she described a series of possibly hypothyroid symptoms including weight gain of 7 pounds, fatigue, decreased stamina, hair breakage, increased pressure in neck, however, a TSH at that time was normal.  Free T4 was slightly low.  Since the symptoms started after Easter, when she ate gluten, we discussed about the influence of GI inflammation on her immune system.  She was planning to restart a gluten-free, low carb, diet so we decided to just follow her without treatment and repeat the test at this visit.  We continued selenium. -At today's visit, she feels better, with only slight fatigue (she was admitted 3 weeks ago in [place] for dehydration after she stayed out in the sun for a long time) but otherwise much improved.  She also does not have neck compression symptoms.  Retrospectively, she feels that her symptoms may have been a delayed reaction to the COVID-19 vaccine. -We will repeat her TFTs today  -We will check her TPO and ATA antibodies at next visit -for now, we will continue selenium. -She is aware that if we need to start levothyroxine she is to take this on an empty stomach, separated by food and other medications. -I will see her back in 4 to 6 months, but possibly sooner for labs  Orders Placed This Encounter  Procedures  . TSH  . T4, free  . T3, free    Philemon Kingdom, MD PhD [facility] Endocrinology

## 2019-09-25 MED FILL — AMPHETAMINE-DEXTROAMPHETAMI: 30 | 30 days supply | Qty: 60 | Fill #0

## 2019-10-07 MED FILL — AMPHETAMINE SALTS 10 MG: 10 | 30 days supply | Qty: 30 | Fill #0

## 2019-10-23 MED FILL — AMPHETAMINE-DEXTROAMPHETAMI: 30 | 30 days supply | Qty: 60 | Fill #0

## 2019-11-04 MED FILL — AMPHETAMINE SALTS 10 MG: 10 | 30 days supply | Qty: 30 | Fill #0

## 2019-11-08 ENCOUNTER — Other Ambulatory Visit (HOSPITAL_COMMUNITY): Payer: Self-pay | Admitting: Nurse Practitioner

## 2019-11-08 MED FILL — TOPIRAMATE 25 MG TABLET: 25 | 30 days supply | Qty: 60 | Fill #0

## 2019-11-08 MED FILL — HYDROCHLOROTHIAZIDE 12.5 MG: 12.5 | 30 days supply | Qty: 30 | Fill #0

## 2019-11-21 MED FILL — AMPHETAMINE-DEXTROAMPHETAMI: 30 | 30 days supply | Qty: 60 | Fill #0

## 2019-12-02 MED FILL — AMPHETAMINE SALTS 10 MG: 10 | 30 days supply | Qty: 30 | Fill #0

## 2019-12-19 MED FILL — AMPHETAMINE-DEXTROAMPHETAMI: 30 | 30 days supply | Qty: 60 | Fill #0

## 2019-12-26 ENCOUNTER — Other Ambulatory Visit (HOSPITAL_COMMUNITY): Payer: Self-pay | Admitting: Nurse Practitioner

## 2019-12-26 MED FILL — GUAIATUSSIN AC LIQUID: 100-10 | 5 days supply | Qty: 240 | Fill #0

## 2019-12-26 MED FILL — ONDANSETRON HCL 4 MG TABS: 4 | 10 days supply | Qty: 30 | Fill #0

## 2020-01-03 MED FILL — AMPHETAMINE SALTS 10 MG: 10 | 30 days supply | Qty: 30 | Fill #0

## 2020-01-14 ENCOUNTER — Other Ambulatory Visit (HOSPITAL_COMMUNITY): Payer: Self-pay | Admitting: Nurse Practitioner

## 2020-01-16 MED FILL — AMPHETAMINE-DEXTROAMPHETAMI: 30 | 30 days supply | Qty: 60 | Fill #0

## 2020-01-31 MED FILL — AMPHETAMINE SALTS 10 MG: 10 | 30 days supply | Qty: 30 | Fill #0

## 2020-02-13 MED FILL — AMPHETAMINE-DEXTROAMPHETAMI: 30 | 30 days supply | Qty: 60 | Fill #0

## 2020-02-25 ENCOUNTER — Other Ambulatory Visit (HOSPITAL_COMMUNITY): Payer: Self-pay

## 2020-02-25 MED FILL — AMOXICILLIN 500 MG CAPSULE: 500 | 10 days supply | Qty: 30 | Fill #0

## 2020-02-28 ENCOUNTER — Other Ambulatory Visit (HOSPITAL_COMMUNITY): Payer: Self-pay | Admitting: Nurse Practitioner

## 2020-02-28 MED FILL — AMPHETAMINE SALTS 10 MG: 10 | 30 days supply | Qty: 30 | Fill #0

## 2020-03-10 ENCOUNTER — Other Ambulatory Visit (HOSPITAL_COMMUNITY): Payer: Self-pay | Admitting: Nurse Practitioner

## 2020-03-12 MED FILL — AMPHETAMINE-DEXTROAMPHETAMI: 30 | 30 days supply | Qty: 60 | Fill #0

## 2020-03-13 ENCOUNTER — Ambulatory Visit: Payer: Self-pay | Admitting: Internal Medicine

## 2020-03-27 ENCOUNTER — Other Ambulatory Visit (HOSPITAL_COMMUNITY): Payer: Self-pay | Admitting: Nurse Practitioner

## 2020-03-27 MED FILL — AMPHETAMINE-DEXTROAMPHETAMI: 10 | 30 days supply | Qty: 30 | Fill #0

## 2020-04-08 ENCOUNTER — Other Ambulatory Visit (HOSPITAL_COMMUNITY): Payer: Self-pay

## 2020-04-08 MED FILL — Amphetamine-Dextroamphetamine Tab 30 MG: ORAL | 30 days supply | Qty: 60 | Fill #0 | Status: CN

## 2020-04-09 ENCOUNTER — Other Ambulatory Visit (HOSPITAL_COMMUNITY): Payer: Self-pay

## 2020-04-09 MED FILL — Amphetamine-Dextroamphetamine Tab 30 MG: ORAL | 30 days supply | Qty: 60 | Fill #0 | Status: AC

## 2020-04-24 ENCOUNTER — Other Ambulatory Visit (HOSPITAL_COMMUNITY): Payer: Self-pay

## 2020-04-24 MED FILL — Amphetamine-Dextroamphetamine Tab 10 MG: ORAL | 30 days supply | Qty: 30 | Fill #0 | Status: AC

## 2020-04-27 ENCOUNTER — Ambulatory Visit: Payer: Self-pay | Admitting: Internal Medicine

## 2020-04-27 NOTE — Progress Notes (Deleted)
Patient ID: Tisha Cline, female   DOB: [birth year], 45 y.o.   MRN: [id number]   This visit occurred during the SARS-CoV-2 public health emergency.  Safety protocols were in place, including screening questions prior to the visit, additional usage of staff PPE, and extensive cleaning of exam room while observing appropriate contact time as indicated for disinfecting solutions.   HPI  Kerilyn Cortner is a 45 y.o.-year-old female, returning for follow-up for Hashimoto's thyroiditis.  Last visit 7 months ago.  Interim history:   Reviewed and addended history: She was diagnosed with hypothyroidism in 2012.  Her TFTs normalized afterwards so she was not started on levothyroxine.  However, a TSH was again elevated in 12/2018, and normalized again afterwards.    At our last visit in 05/2019, she described that for the previous month she had to feel more fatigued, gained weight, decreased interest in exercising. No cold intolerance, constipation, hair loss but she had hair breakage.  Symptoms started after Easter, possibly after she ate gluten.  At that time, she also had an episode of anterior neck pressure so she felt that the symptoms may have been related to the thyroid.  We continued selenium but did not start levothyroxine at that time.  Discussed about improving diet.  She did  do so since last visit.  At last visit, she was almost back to normal.  She had some more stress at home so was not able to sleep very well, but otherwise, she felt much better.  Retrospectively, she felt that her symptoms were related to a delayed reaction to the COVID-19 vaccine.  We did not have to start levothyroxine.  Reviewed her TFTs: Lab Results  Component Value Date   TSH 1.710 05/10/2019   TSH 2.74 01/23/2019   TSH 7.320 (H) 12/26/2018   TSH 2.880 12/25/2017   TSH 5.964 (H) 08/04/2010   FREET4 0.73 (L) 05/10/2019   FREET4 0.81 01/23/2019   FREET4 1.12 12/26/2018   T3FREE 2.4 05/10/2019   T3FREE 3.3  01/23/2019   Lab Results  Component Value Date   T3FREE 2.4 05/10/2019   T3FREE 3.3 01/23/2019    Her antithyroid antibodies were elevated: Component     Latest Ref Rng & Units 05/10/2019  Thyroperoxidase Ab SerPl-aCnc     0 - 34 IU/mL 112 (H)  Thyroglobulin Antibody     0.0 - 0.9 IU/mL 2.3 (H)   Component     Latest Ref Rng & Units 01/23/2019  Thyroglobulin Ab     < or = 1 IU/mL 2 (H)  Thyroperoxidase Ab SerPl-aCnc     <9 IU/mL 138 (H)   We started selenium 200 mcg daily 01/2019.  Pt denies: - feeling nodules in neck - hoarseness - dysphagia - choking - SOB with lying down She had occasional episodes of neck pressure in the past, resolved.  Thyroid ultrasound (01/14/2019): thyroid appeared moderately heterogeneous and potentially hyperemic but it was normal in size and without nodules  She has + FH of thyroid disorders in: MGM - Graves ds, M aunt - goiter. No FH of thyroid cancer. No h/o radiation tx to head or neck.  No seaweed or kelp. No recent contrast studies. No herbal supplements. No Biotin use. No recent steroids use.   He has a history of celiac disease. She has a h/o R heel cellulitis and sepsis in 2018. Pt. also has a history of ADHD-on Adderall. She exercises 3 times a week.  ROS: Constitutional: no weight  gain/no weight loss, no fatigue, no subjective hyperthermia, no subjective hypothermia Eyes: no blurry vision, no xerophthalmia ENT: no sore throat, + see HPI Cardiovascular: no CP/no SOB/no palpitations/no leg swelling Respiratory: no cough/no SOB/no wheezing Gastrointestinal: no N/no V/no D/no C/no acid reflux Musculoskeletal: no muscle aches/no joint aches Skin: no rashes, no  hair losss Neurological: no tremors/no numbness/no tingling/no dizziness  I reviewed pt's medications, allergies, PMH, social hx, family hx, and changes were documented in the history of present illness. Otherwise, unchanged from my initial visit note.  Past Medical History:   Diagnosis Date  . ADD (attention deficit disorder)   . Celiac disease   . Depression   . Pinched vertebral nerve    No past surgical history on file. Social History   Socioeconomic History  . Marital status: Divorced    Spouse name: Not on file  . Number of children: 2  . Years of education: Not on file  . Highest education level: Not on file  Occupational History  . Occupation: Secondary school teacher  Tobacco Use  . Smoking status: Never Smoker  . Smokeless tobacco: Never Used  Vaping Use  . Vaping Use: Never used  Substance and Sexual Activity  . Alcohol use: Yes    Alcohol/week: 3.0 standard drinks    Types: 3 Glasses of wine per week  . Drug use: No  . Sexual activity: Not Currently  Other Topics Concern  . Not on file  Social History Narrative  . Not on file   Social Determinants of Health   Financial Resource Strain: Not on file  Food Insecurity: Not on file  Transportation Needs: Not on file  Physical Activity: Not on file  Stress: Not on file  Social Connections: Not on file  Intimate Partner Violence: Not on file   Current Outpatient Medications on File Prior to Visit  Medication Sig Dispense Refill  . amoxicillin (AMOXIL) 500 MG capsule TAKE 1 CAPSULE BY MOUTH EVERY 8 HOURS FOR 10 DAYS 30 capsule 0  . amphetamine-dextroamphetamine (ADDERALL) 10 MG tablet 1 tablet by mouth daily 30 tablet 0  . amphetamine-dextroamphetamine (ADDERALL) 10 MG tablet TAKE 1 TABLET BY MOUTH DAILY DO NOT FILL UNTIL 04/07/20 30 tablet 0  . amphetamine-dextroamphetamine (ADDERALL) 10 MG tablet TAKE 1 TABLET BY MOUTH DAILY 30 tablet 0  . amphetamine-dextroamphetamine (ADDERALL) 10 MG tablet TAKE 1 TABLET BY MOUTH DAILY AS NEEDED DO NOT FILL UNTIL 01/16/20 30 tablet 0  . amphetamine-dextroamphetamine (ADDERALL) 10 MG tablet TAKE 1 TABLET BY MOUTH DAILY AS NEEDED 30 tablet 0  . amphetamine-dextroamphetamine (ADDERALL) 10 MG tablet TAKE 1 TABLET BY MOUTH DAILY DO NOT FILL UNTIL 12/06/19 30  tablet 0  . amphetamine-dextroamphetamine (ADDERALL) 10 MG tablet TAKE 1 TABLET BY MOUTH ONCE A DAY AS NEEDED 30 tablet 0  . amphetamine-dextroamphetamine (ADDERALL) 30 MG tablet Take 1 tablet by mouth 2 (two) times daily. 60 tablet 0  . amphetamine-dextroamphetamine (ADDERALL) 30 MG tablet TAKE 1 TABLET BY MOUTH 2 TIMES DAILY 60 tablet 0  . amphetamine-dextroamphetamine (ADDERALL) 30 MG tablet TAKE 1 TABLET BY MOUTH 2 TIMES DAILY DO NOT FILL UNTIL 03/12/20 60 tablet 0  . amphetamine-dextroamphetamine (ADDERALL) 30 MG tablet TAKE 1 TABLET BY MOUTH 2 TIMES DAILY DO NOT FILL UNTIL 05/05/20 60 tablet 0  . amphetamine-dextroamphetamine (ADDERALL) 30 MG tablet TAKE 1 TABLET BY MOUTH 2 TIMES DAILY DO NOT FILL UNTIL 01/16/20 60 tablet 0  . amphetamine-dextroamphetamine (ADDERALL) 30 MG tablet TAKE 1 TABLET BY MOUTH TWICE DAILY-- DO  NOT FILL UNTIL 12/06/19 60 tablet 0  . amphetamine-dextroamphetamine (ADDERALL) 30 MG tablet TAKE 1 TABLET BY MOUTH TWO TIMES DAILY DO NOT FILL UNTIL 01/03/20 60 tablet 0  . amphetamine-dextroamphetamine (ADDERALL) 30 MG tablet TAKE 1 TABLET BY MOUTH TWO TIMES DAILY 60 tablet 0  . guaiFENesin-codeine (ROBITUSSIN AC) 100-10 MG/5ML syrup TAKE 5 TO 10 MLS BY MOUTH EVERY 4 HOURS AS NEEDED 240 mL 0  . hydrochlorothiazide (HYDRODIURIL) 12.5 MG tablet TAKE 1 TABLET BY MOUTH EVERY MORNING 30 tablet 5  . hydrochlorothiazide (MICROZIDE) 12.5 MG capsule 30TAKE 1 CAPSULE BY MOUTH DAILY IN THE MORNING AS NEEDED FOR SWELLING/FLUID 30 capsule 0  . Multiple Vitamin (MULTIVITAMIN) capsule Take 1 capsule by mouth daily.    . ondansetron (ZOFRAN) 4 MG tablet TAKE 1 TABLET BY MOUTH 3 TIMES DAILY AS NEEDED FOR 10 DAYS 30 tablet 0  . ondansetron (ZOFRAN-ODT) 8 MG disintegrating tablet Take 1 tablet (8 mg total) by mouth every 8 (eight) hours as needed for nausea or vomiting. 20 tablet 1  . selenium (SELENIMIN-200) 200 MCG TABS tablet Take 200 mcg by mouth daily.    Marland Kitchen topiramate (TOPAMAX) 25 MG tablet  TAKE 1 TABLET BY MOUTH ONCE A DAY FOR 7 DAYS THEN INCREASE TO 1 TABLET BY MOUTH TWICE A DAY 60 tablet 0  . traZODone (DESYREL) 50 MG tablet Take 0.5-1 tablets (25-50 mg total) by mouth at bedtime as needed for sleep. 30 tablet 3   No current facility-administered medications on file prior to visit.   Allergies  Allergen Reactions  . Gluten Meal Nausea Only   Family History  Problem Relation Age of Onset  . Alcohol abuse Mother   . Cancer Mother        breast  . Alcohol abuse Maternal Aunt   . Alcohol abuse Maternal Uncle   . Diabetes Maternal Grandmother   . Hypertension Maternal Grandmother   . Alcohol abuse Maternal Grandfather   . Hypertension Maternal Grandfather   . Stroke Paternal Grandfather    PE: There were no vitals taken for this visit. Wt Readings from Last 3 Encounters:  09/13/19 116 lb (52.6 kg)  05/10/19 123 lb (55.8 kg)  01/23/19 116 lb (52.6 kg)   Constitutional: normal weight, in NAD Eyes: PERRLA, EOMI, no exophthalmos ENT: moist mucous membranes, no thyromegaly, no cervical lymphadenopathy Cardiovascular: RRR, No MRG Respiratory: CTA B Gastrointestinal: abdomen soft, NT, ND, BS+ Musculoskeletal: no deformities, strength intact in all 4 Skin: moist, warm, no rashes Neurological: no tremor with outstretched hands, DTR normal in all 4  ASSESSMENT: 1.  Hashimoto's thyroiditis  PLAN:  1. Patient with history of elevated TSH initially with normal free thyroid hormone, but then with a low free T4, which, however, resolved.  We did not have to start levothyroxine.   -She has fluctuating TPO and ATA antibodies, even on selenium, which was started in 01/2019. -She came to see me after last Easter with weight gain of 7 pounds, fatigue, decreased stamina, hair breakage, increased pressure in neck, however, a normal TSH.  Free T4 was slightly low.  She ate more gluten for Easter and we discussed about improving diet first and reevaluating her TFTs.  We continued  selenium.  At our last visit, she felt much better, with only slight fatigue, but otherwise much improved.  Retrospectively, she feels that her symptoms may have been a delayed reaction to the COVID-19 vaccine -At today's visit, she has no complaints including no neck compression symptoms -We will repeat  her TFTs today + recheck her TPO and ATA antibodies -For now continue selenium 200 mg daily -She is aware that if we need to start levothyroxine, she needs to take this on empty stomach, separated by food or other medications -I will see her back in 6 months, possibly sooner for labs  No orders of the defined types were placed in this encounter.   Philemon Kingdom, MD PhD [facility] Endocrinology

## 2020-05-08 ENCOUNTER — Other Ambulatory Visit (HOSPITAL_COMMUNITY): Payer: Self-pay

## 2020-05-08 MED ORDER — AMPHETAMINE-DEXTROAMPHETAMINE 30 MG PO TABS: 30.0000 mg | ORAL_TABLET | Freq: Two times a day (BID) | ORAL | 0 refills | Status: DC

## 2020-05-08 MED ORDER — AMPHETAMINE-DEXTROAMPHETAMINE 10 MG PO TABS
10.0000 mg | ORAL_TABLET | Freq: Every day | ORAL | 0 refills | Status: DC
  Filled 2020-06-26: qty 30, 30d supply, fill #0

## 2020-05-08 MED ORDER — AMPHETAMINE-DEXTROAMPHETAMINE 30 MG PO TABS
30.0000 mg | ORAL_TABLET | Freq: Two times a day (BID) | ORAL | 0 refills | Status: DC
  Filled 2020-05-08: qty 60, 30d supply, fill #0

## 2020-05-08 MED ORDER — AMPHETAMINE-DEXTROAMPHETAMINE 10 MG PO TABS
10.0000 mg | ORAL_TABLET | Freq: Every day | ORAL | 0 refills | Status: DC
  Filled 2020-05-22: qty 30, 30d supply, fill #0

## 2020-05-08 MED ORDER — AMPHETAMINE-DEXTROAMPHETAMINE 30 MG PO TABS
30.0000 mg | ORAL_TABLET | Freq: Two times a day (BID) | ORAL | 0 refills | Status: DC
  Filled 2020-06-25 – 2020-07-28 (×4): qty 60, 30d supply, fill #0

## 2020-05-08 MED ORDER — AMPHETAMINE-DEXTROAMPHETAMINE 10 MG PO TABS
10.0000 mg | ORAL_TABLET | Freq: Every day | ORAL | 0 refills | Status: DC | PRN
  Filled 2020-06-25 – 2020-07-23 (×2): qty 30, 30d supply, fill #0

## 2020-05-09 ENCOUNTER — Other Ambulatory Visit: Payer: Self-pay

## 2020-05-22 ENCOUNTER — Other Ambulatory Visit (HOSPITAL_COMMUNITY): Payer: Self-pay

## 2020-05-28 ENCOUNTER — Other Ambulatory Visit (HOSPITAL_COMMUNITY): Payer: Self-pay

## 2020-06-25 ENCOUNTER — Other Ambulatory Visit (HOSPITAL_COMMUNITY): Payer: Self-pay

## 2020-06-26 ENCOUNTER — Other Ambulatory Visit (HOSPITAL_COMMUNITY): Payer: Self-pay

## 2020-07-01 ENCOUNTER — Other Ambulatory Visit: Payer: Self-pay

## 2020-07-01 ENCOUNTER — Other Ambulatory Visit (HOSPITAL_COMMUNITY): Payer: Self-pay

## 2020-07-01 ENCOUNTER — Ambulatory Visit (INDEPENDENT_AMBULATORY_CARE_PROVIDER_SITE_OTHER): Payer: Self-pay | Admitting: Internal Medicine

## 2020-07-01 VITALS — BP 110/80 | HR 94 | Ht 64.0 in | Wt 124.0 lb

## 2020-07-01 DIAGNOSIS — E063 Autoimmune thyroiditis: Secondary | ICD-10-CM

## 2020-07-01 LAB — T4, FREE: Free T4: 0.65 ng/dL (ref 0.60–1.60)

## 2020-07-01 LAB — TSH: TSH: 2.87 u[IU]/mL (ref 0.35–5.50)

## 2020-07-01 LAB — T3, FREE: T3, Free: 3.1 pg/mL (ref 2.3–4.2)

## 2020-07-01 MED FILL — Amphetamine-Dextroamphetamine Tab 30 MG: ORAL | 30 days supply | Qty: 60 | Fill #0 | Status: AC

## 2020-07-01 NOTE — Patient Instructions (Signed)
Please stop at the lab.  Continue Selenium 200 mcg daily.  Please come back for a follow-up appointment in 6 months.

## 2020-07-01 NOTE — Progress Notes (Addendum)
Patient ID: Michaela Bryant, female   DOB: February 08, 1975, 45 y.o.   MRN: [id number]   This visit occurred during the SARS-CoV-2 public health emergency.  Safety protocols were in place, including screening questions prior to the visit, additional usage of staff PPE, and extensive cleaning of exam room while observing appropriate contact time as indicated for disinfecting solutions.   HPI  Michaela Bryant is a 45 y.o.-year-old female, returning for follow-up for Hashimoto's thyroiditis.  Last visit 9 months ago.  Interim history: She got Covid19 in 12/2019 and did not recover completely yet. She still has fatigue, 6-8 lbs weight gain, had nausea x2 months.  He used to see Dr. Collene Mares for her celiac ds. Would like to return to see GI again. She admits for eating more gluten recently. She tells me she has to adjust the dose of her Adder to be all frequently. She feels her thyroid hormones are more fluctuating after her COVID-19 infection.  Reviewed history: She was diagnosed with hypothyroidism in 2012.  Her TFTs normalized afterwards so she was not started on levothyroxine.  However, a TSH was again elevated in 12/2018, and normalized again afterwards.    At our last visit in 05/2019, she described that for the previous month she had to feel more fatigued, gained weight, decreased interest in exercising. No cold intolerance, constipation, hair loss but she had hair breakage.  Symptoms started after Easter, possibly after she ate gluten.  At that time, she also had an episode of anterior neck pressure so she felt that the symptoms may have been related to the thyroid.  We continued selenium but did not start levothyroxine at that time.  Discussed about improving diet.  She did start to eliminate gluten afterwards and she started to feel much better.  Reviewed her TFTs: Lab Results  Component Value Date   TSH 1.710 05/10/2019   TSH 2.74 01/23/2019   TSH 7.320 (H) 12/26/2018   TSH 2.880 12/25/2017    TSH 5.964 (H) 08/04/2010   FREET4 0.73 (L) 05/10/2019   FREET4 0.81 01/23/2019   FREET4 1.12 12/26/2018   T3FREE 2.4 05/10/2019   T3FREE 3.3 01/23/2019   Lab Results  Component Value Date   T3FREE 2.4 05/10/2019   T3FREE 3.3 01/23/2019    Her antithyroid antibodies are elevated: Component     Latest Ref Rng & Units 05/10/2019  Thyroperoxidase Ab SerPl-aCnc     0 - 34 IU/mL 112 (H)  Thyroglobulin Antibody     0.0 - 0.9 IU/mL 2.3 (H)   Component     Latest Ref Rng & Units 01/23/2019  Thyroglobulin Ab     < or = 1 IU/mL 2 (H)  Thyroperoxidase Ab SerPl-aCnc     <9 IU/mL 138 (H)   We started selenium 200 mcg daily 01/2019.  She feels better on this and continues it.  Pt denies: - feeling nodules in neck - hoarseness - dysphagia - choking - SOB with lying down  She had occasional episodes of neck pressure in the past, resolved.  Thyroid ultrasound (01/14/2019): thyroid appeared moderately heterogeneous and potentially hyperemic but it was normal in size and without nodules  She has + FH of thyroid disorders in: MGM - Graves ds, M aunt - goiter. No FH of thyroid cancer. No h/o radiation tx to head or neck.  No herbal supplements. No Biotin use. No recent steroids use.   He has a history of celiac disease. She has a h/o R  heel cellulitis and sepsis in 2018. Pt. also has a history of ADHD-on Adderall.  ROS: Constitutional: + weight gain/no weight loss, + fatigue, no subjective hyperthermia, no subjective hypothermia Eyes: no blurry vision, no xerophthalmia ENT: no sore throat, + see HPI Cardiovascular: no CP/no SOB/no palpitations/no leg swelling Respiratory: no cough/no SOB/no wheezing Gastrointestinal: Resolved N/no V/no D/no C/no acid reflux Musculoskeletal: no muscle aches/no joint aches Skin: no rashes, resolved hair breakage Neurological: no tremors/no numbness/no tingling/no dizziness  I reviewed pt's medications, allergies, PMH, social hx, family hx, and changes  were documented in the history of present illness. Otherwise, unchanged from my initial visit note.  Past Medical History:  Diagnosis Date   ADD (attention deficit disorder)    Celiac disease    Depression    Pinched vertebral nerve    No past surgical history on file. Social History   Socioeconomic History   Marital status: Divorced    Spouse name: Not on file   Number of children: 2   Years of education: Not on file   Highest education level: Not on file  Occupational History   Occupation: Secondary school teacher  Tobacco Use   Smoking status: Never   Smokeless tobacco: Never  Vaping Use   Vaping Use: Never used  Substance and Sexual Activity   Alcohol use: Yes    Alcohol/week: 3.0 standard drinks    Types: 3 Glasses of wine per week   Drug use: No   Sexual activity: Not Currently  Other Topics Concern   Not on file  Social History Narrative   Not on file   Social Determinants of Health   Financial Resource Strain: Not on file  Food Insecurity: Not on file  Transportation Needs: Not on file  Physical Activity: Not on file  Stress: Not on file  Social Connections: Not on file  Intimate Partner Violence: Not on file   Current Outpatient Medications on File Prior to Visit  Medication Sig Dispense Refill   amoxicillin (AMOXIL) 500 MG capsule TAKE 1 CAPSULE BY MOUTH EVERY 8 HOURS FOR 10 DAYS 30 capsule 0   amphetamine-dextroamphetamine (ADDERALL) 10 MG tablet 1 tablet by mouth daily 30 tablet 0   amphetamine-dextroamphetamine (ADDERALL) 10 MG tablet TAKE 1 TABLET BY MOUTH DAILY 30 tablet 0   amphetamine-dextroamphetamine (ADDERALL) 10 MG tablet TAKE 1 TABLET BY MOUTH DAILY AS NEEDED DO NOT FILL UNTIL 01/16/20 30 tablet 0   amphetamine-dextroamphetamine (ADDERALL) 10 MG tablet TAKE 1 TABLET BY MOUTH DAILY AS NEEDED 30 tablet 0   amphetamine-dextroamphetamine (ADDERALL) 10 MG tablet TAKE 1 TABLET BY MOUTH DAILY DO NOT FILL UNTIL 12/06/19 30 tablet 0    amphetamine-dextroamphetamine (ADDERALL) 10 MG tablet TAKE 1 TABLET BY MOUTH ONCE A DAY AS NEEDED 30 tablet 0   amphetamine-dextroamphetamine (ADDERALL) 10 MG tablet Take 1 tablet (10 mg total) by mouth daily (fill 05/08/20) 30 tablet 0   amphetamine-dextroamphetamine (ADDERALL) 10 MG tablet Take 1 tablet (10 mg total) by mouth daily as needed (fill 07/03/20) 30 tablet 0   amphetamine-dextroamphetamine (ADDERALL) 10 MG tablet Take 1 tablet (10 mg total) by mouth daily (fill 06/05/20) 30 tablet 0   amphetamine-dextroamphetamine (ADDERALL) 30 MG tablet Take 1 tablet by mouth 2 (two) times daily. 60 tablet 0   amphetamine-dextroamphetamine (ADDERALL) 30 MG tablet TAKE 1 TABLET BY MOUTH 2 TIMES DAILY 60 tablet 0   amphetamine-dextroamphetamine (ADDERALL) 30 MG tablet TAKE 1 TABLET BY MOUTH 2 TIMES DAILY DO NOT FILL UNTIL 03/12/20 60 tablet 0  amphetamine-dextroamphetamine (ADDERALL) 30 MG tablet TAKE 1 TABLET BY MOUTH 2 TIMES DAILY DO NOT FILL UNTIL 05/05/20 60 tablet 0   amphetamine-dextroamphetamine (ADDERALL) 30 MG tablet TAKE 1 TABLET BY MOUTH 2 TIMES DAILY DO NOT FILL UNTIL 01/16/20 60 tablet 0   amphetamine-dextroamphetamine (ADDERALL) 30 MG tablet TAKE 1 TABLET BY MOUTH TWICE DAILY-- DO NOT FILL UNTIL 12/06/19 60 tablet 0   amphetamine-dextroamphetamine (ADDERALL) 30 MG tablet TAKE 1 TABLET BY MOUTH TWO TIMES DAILY DO NOT FILL UNTIL 01/03/20 60 tablet 0   amphetamine-dextroamphetamine (ADDERALL) 30 MG tablet TAKE 1 TABLET BY MOUTH TWO TIMES DAILY 60 tablet 0   amphetamine-dextroamphetamine (ADDERALL) 30 MG tablet Take 1 tablet by mouth 2 (two) times daily. 60 tablet 0   amphetamine-dextroamphetamine (ADDERALL) 30 MG tablet Take 1 tablet by mouth 2 (two) times daily (fill 07/03/20) 60 tablet 0   hydrochlorothiazide (HYDRODIURIL) 12.5 MG tablet TAKE 1 TABLET BY MOUTH EVERY MORNING 30 tablet 5   hydrochlorothiazide (MICROZIDE) 12.5 MG capsule 30TAKE 1 CAPSULE BY MOUTH DAILY IN THE MORNING AS NEEDED FOR  SWELLING/FLUID 30 capsule 0   Multiple Vitamin (MULTIVITAMIN) capsule Take 1 capsule by mouth daily.     ondansetron (ZOFRAN) 4 MG tablet TAKE 1 TABLET BY MOUTH 3 TIMES DAILY AS NEEDED FOR 10 DAYS 30 tablet 0   ondansetron (ZOFRAN-ODT) 8 MG disintegrating tablet Take 1 tablet (8 mg total) by mouth every 8 (eight) hours as needed for nausea or vomiting. 20 tablet 1   selenium (SELENIMIN-200) 200 MCG TABS tablet Take 200 mcg by mouth daily.     topiramate (TOPAMAX) 25 MG tablet TAKE 1 TABLET BY MOUTH ONCE A DAY FOR 7 DAYS THEN INCREASE TO 1 TABLET BY MOUTH TWICE A DAY 60 tablet 0   traZODone (DESYREL) 50 MG tablet Take 0.5-1 tablets (25-50 mg total) by mouth at bedtime as needed for sleep. 30 tablet 3   No current facility-administered medications on file prior to visit.   Allergies  Allergen Reactions   Gluten Meal Nausea Only   Family History  Problem Relation Age of Onset   Alcohol abuse Mother    Cancer Mother        breast   Alcohol abuse Maternal Aunt    Alcohol abuse Maternal Uncle    Diabetes Maternal Grandmother    Hypertension Maternal Grandmother    Alcohol abuse Maternal Grandfather    Hypertension Maternal Grandfather    Stroke Paternal Grandfather    PE: BP 110/80   Pulse 94   Ht 5' 4"  (1.626 m)   Wt 124 lb (56.2 kg)   LMP 06/30/2020   SpO2 97%   BMI 21.28 kg/m  Wt Readings from Last 3 Encounters:  07/01/20 124 lb (56.2 kg)  09/13/19 116 lb (52.6 kg)  05/10/19 123 lb (55.8 kg)   Constitutional: normal weight, in NAD Eyes: PERRLA, EOMI, no exophthalmos ENT: moist mucous membranes, no thyromegaly, no cervical lymphadenopathy Cardiovascular: RRR, No MRG Respiratory: CTA B Gastrointestinal: abdomen soft, NT, ND, BS+ Musculoskeletal: no deformities, strength intact in all 4 Skin: moist, warm, no rashes Neurological: no tremor with outstretched hands, DTR normal in all 4  ASSESSMENT: 1.  Hashimoto's thyroiditis  PLAN:  1. Patient with history of elevated  TSH in the past, after which she was diagnosed with Hashimoto's thyroiditis.  Her TFTs have been fluctuating, with the most recent results being normal, but the last set of TFTs were checked more than a year ago.  She had fluctuating TPO and  TG antibodies, even on selenium, which restarted in 01/2019. She does feel better on selenium, so we will continue on this. -At last visit, she was feeling better after she started a more strict gluten-free diet and low-carb so I did not suggest any changes in treatment especially since her TFTs were normal. -However, since then, she describes getting COVID19 at the end of last year, and afterwards, she started to feel poorly.  She also had more gluten since then.  She started to experience more fatigue, she gained 8 pounds per our scale, and also had 2 months of nausea.  She does feel that her thyroid hormones are fluctuating more and many times she needs to stop her Adderall dose to be able to feel better. -At this visit, we discussed about the importance of getting her celiac disease under control.  She is planning to see GI again. -We also discussed that Hashimoto's thyroiditis is actually a manifestation of an immune dysregulation, and it is possible that her symptoms are unrelated to this.  However, one way to stabilize her thyroid tests is to start a low-dose levothyroxine and see how she feels on it.  She would very much like to try this.  If she does not feel better on it or her TSH becomes suppressed, we can always stop it. -At today's visit, we will check her TFTs and also TPO and ATA antibodies -She is aware about how to take levothyroxine correctly in case we need to start -I will see her back in 6 months, but possibly sooner for labs  Orders Placed This Encounter  Procedures   TSH   T4, free   T3, free   Thyroglobulin antibody   Thyroid peroxidase antibody   Office Visit on 07/01/2020  Component Date Value Ref Range Status   TSH 07/01/2020 2.87   0.35 - 5.50 uIU/mL Final   Free T4 07/01/2020 0.65  0.60 - 1.60 ng/dL Final   Comment: Specimens from patients who are undergoing biotin therapy and /or ingesting biotin supplements may contain high levels of biotin.  The higher biotin concentration in these specimens interferes with this Free T4 assay.  Specimens that contain high levels  of biotin may cause false high results for this Free T4 assay.  Please interpret results in light of the total clinical presentation of the patient.     T3, Free 07/01/2020 3.1  2.3 - 4.2 pg/mL Final   Based on the above results and her preference, I advised her to start levothyroxine 25 mg daily and we will check her TFTs in 1.5 months.  Component     Latest Ref Rng & Units 07/01/2020  Thyroglobulin Ab     < or = 1 IU/mL 3 (H)  Thyroperoxidase Ab SerPl-aCnc     <9 IU/mL 342 (H)  Her thyroid antibodies are higher. LT4 tx can help improve the titers.  Philemon Kingdom, MD PhD [facility] Endocrinology

## 2020-07-02 ENCOUNTER — Other Ambulatory Visit: Payer: Self-pay | Admitting: Internal Medicine

## 2020-07-02 ENCOUNTER — Other Ambulatory Visit (HOSPITAL_COMMUNITY): Payer: Self-pay

## 2020-07-02 LAB — THYROGLOBULIN ANTIBODY: Thyroglobulin Ab: 3 IU/mL — ABNORMAL HIGH (ref <=1)

## 2020-07-02 LAB — THYROID PEROXIDASE ANTIBODY: Thyroperoxidase Ab SerPl-aCnc: 342 IU/mL — ABNORMAL HIGH (ref <9)

## 2020-07-02 MED ORDER — LEVOTHYROXINE SODIUM 25 MCG PO TABS
25.0000 ug | ORAL_TABLET | Freq: Every day | ORAL | 3 refills | Status: DC
  Filled 2020-07-02: qty 30, 30d supply, fill #0
  Filled 2020-08-08: qty 30, 30d supply, fill #1
  Filled 2020-09-22: qty 30, 30d supply, fill #2
  Filled 2020-10-12: qty 30, 30d supply, fill #3
  Filled 2020-11-16: qty 30, 30d supply, fill #4
  Filled 2020-12-17: qty 30, 30d supply, fill #5
  Filled 2021-02-17: qty 30, 30d supply, fill #6
  Filled 2021-03-26: qty 30, 30d supply, fill #7

## 2020-07-03 ENCOUNTER — Encounter: Payer: Self-pay | Admitting: Internal Medicine

## 2020-07-08 ENCOUNTER — Encounter: Payer: Self-pay | Admitting: Internal Medicine

## 2020-07-23 ENCOUNTER — Other Ambulatory Visit (HOSPITAL_COMMUNITY): Payer: Self-pay

## 2020-07-28 ENCOUNTER — Other Ambulatory Visit (HOSPITAL_COMMUNITY): Payer: Self-pay

## 2020-08-08 ENCOUNTER — Other Ambulatory Visit (HOSPITAL_COMMUNITY): Payer: Self-pay

## 2020-08-15 ENCOUNTER — Other Ambulatory Visit (HOSPITAL_COMMUNITY): Payer: Self-pay

## 2020-08-21 ENCOUNTER — Other Ambulatory Visit (HOSPITAL_COMMUNITY): Payer: Self-pay

## 2020-08-21 MED ORDER — AMPHETAMINE-DEXTROAMPHETAMINE 10 MG PO TABS
10.0000 mg | ORAL_TABLET | Freq: Every day | ORAL | 0 refills | Status: DC
  Filled 2020-10-15 – 2020-10-17 (×3): qty 30, 30d supply, fill #0

## 2020-08-21 MED ORDER — AMPHETAMINE-DEXTROAMPHETAMINE 10 MG PO TABS
10.0000 mg | ORAL_TABLET | Freq: Every day | ORAL | 0 refills | Status: DC
  Filled 2020-09-18 – 2020-09-19 (×2): qty 30, 30d supply, fill #0

## 2020-08-21 MED ORDER — AMPHETAMINE-DEXTROAMPHETAMINE 10 MG PO TABS
10.0000 mg | ORAL_TABLET | Freq: Every day | ORAL | 0 refills | Status: DC
  Filled 2020-08-21: qty 30, 30d supply, fill #0

## 2020-08-24 ENCOUNTER — Other Ambulatory Visit (HOSPITAL_COMMUNITY): Payer: Self-pay

## 2020-08-24 MED ORDER — AMPHETAMINE-DEXTROAMPHETAMINE 30 MG PO TABS
30.0000 mg | ORAL_TABLET | Freq: Two times a day (BID) | ORAL | 0 refills | Status: DC
  Filled 2020-08-25: qty 60, 30d supply, fill #0

## 2020-08-24 MED ORDER — AMPHETAMINE-DEXTROAMPHETAMINE 30 MG PO TABS
30.0000 mg | ORAL_TABLET | Freq: Two times a day (BID) | ORAL | 0 refills | Status: DC
  Filled 2020-10-19 – 2020-10-20 (×2): qty 60, 30d supply, fill #0

## 2020-08-24 MED ORDER — AMPHETAMINE-DEXTROAMPHETAMINE 30 MG PO TABS
30.0000 mg | ORAL_TABLET | Freq: Two times a day (BID) | ORAL | 0 refills | Status: DC
  Filled 2020-09-22: qty 60, 30d supply, fill #0

## 2020-08-25 ENCOUNTER — Other Ambulatory Visit (HOSPITAL_COMMUNITY): Payer: Self-pay

## 2020-09-08 ENCOUNTER — Other Ambulatory Visit (HOSPITAL_COMMUNITY): Payer: Self-pay

## 2020-09-08 MED ORDER — TROKENDI XR 100 MG PO CP24
ORAL_CAPSULE | ORAL | 3 refills | Status: AC
  Filled 2020-09-08 – 2020-11-16 (×2): qty 30, 30d supply, fill #0

## 2020-09-08 MED ORDER — TROKENDI XR 25 MG PO CP24
ORAL_CAPSULE | ORAL | 0 refills | Status: DC
  Filled 2020-09-08: qty 30, 24d supply, fill #0
  Filled 2020-09-28: qty 30, 24d supply, fill #1
  Filled 2020-10-12: qty 30, 7d supply, fill #1
  Filled 2021-02-17: qty 30, 7d supply, fill #2

## 2020-09-18 ENCOUNTER — Other Ambulatory Visit (HOSPITAL_COMMUNITY): Payer: Self-pay

## 2020-09-19 ENCOUNTER — Other Ambulatory Visit (HOSPITAL_COMMUNITY): Payer: Self-pay

## 2020-09-22 ENCOUNTER — Other Ambulatory Visit (HOSPITAL_COMMUNITY): Payer: Self-pay

## 2020-09-29 ENCOUNTER — Other Ambulatory Visit (HOSPITAL_COMMUNITY): Payer: Self-pay

## 2020-10-07 ENCOUNTER — Other Ambulatory Visit (HOSPITAL_COMMUNITY): Payer: Self-pay

## 2020-10-12 ENCOUNTER — Other Ambulatory Visit (HOSPITAL_COMMUNITY): Payer: Self-pay

## 2020-10-15 ENCOUNTER — Other Ambulatory Visit (HOSPITAL_COMMUNITY): Payer: Self-pay

## 2020-10-16 ENCOUNTER — Other Ambulatory Visit (HOSPITAL_COMMUNITY): Payer: Self-pay

## 2020-10-17 ENCOUNTER — Other Ambulatory Visit (HOSPITAL_COMMUNITY): Payer: Self-pay

## 2020-10-19 ENCOUNTER — Other Ambulatory Visit (HOSPITAL_COMMUNITY): Payer: Self-pay

## 2020-10-20 ENCOUNTER — Other Ambulatory Visit (HOSPITAL_COMMUNITY): Payer: Self-pay

## 2020-11-10 ENCOUNTER — Other Ambulatory Visit (HOSPITAL_COMMUNITY): Payer: Self-pay

## 2020-11-10 MED ORDER — AMPHETAMINE-DEXTROAMPHETAMINE 30 MG PO TABS
ORAL_TABLET | ORAL | 0 refills | Status: DC
  Filled 2021-01-07: qty 60, 30d supply, fill #0

## 2020-11-10 MED ORDER — ONDANSETRON HCL 4 MG PO TABS
ORAL_TABLET | ORAL | 0 refills | Status: AC
  Filled 2020-11-10: qty 40, 10d supply, fill #0

## 2020-11-10 MED ORDER — AMPHETAMINE-DEXTROAMPHETAMINE 30 MG PO TABS
ORAL_TABLET | ORAL | 0 refills | Status: DC
  Filled 2021-02-05: qty 60, 30d supply, fill #0

## 2020-11-10 MED ORDER — AMPHETAMINE-DEXTROAMPHETAMINE 30 MG PO TABS
ORAL_TABLET | ORAL | 0 refills | Status: DC
  Filled 2020-11-16 – 2020-11-17 (×2): qty 60, 30d supply, fill #0

## 2020-11-10 MED ORDER — AMPHETAMINE-DEXTROAMPHETAMINE 10 MG PO TABS
ORAL_TABLET | ORAL | 0 refills | Status: DC
  Filled 2020-11-16: qty 30, 30d supply, fill #0

## 2020-11-10 MED ORDER — AMPHETAMINE-DEXTROAMPHETAMINE 10 MG PO TABS
ORAL_TABLET | ORAL | 0 refills | Status: DC
  Filled 2021-02-19: qty 30, 30d supply, fill #0

## 2020-11-10 MED ORDER — AMPHETAMINE-DEXTROAMPHETAMINE 10 MG PO TABS
ORAL_TABLET | ORAL | 0 refills | Status: DC
  Filled 2021-01-21 (×2): qty 30, 30d supply, fill #0

## 2020-11-16 ENCOUNTER — Other Ambulatory Visit (HOSPITAL_COMMUNITY): Payer: Self-pay

## 2020-11-17 ENCOUNTER — Other Ambulatory Visit (HOSPITAL_COMMUNITY): Payer: Self-pay

## 2020-11-18 ENCOUNTER — Other Ambulatory Visit (HOSPITAL_COMMUNITY): Payer: Self-pay

## 2020-12-09 ENCOUNTER — Other Ambulatory Visit (HOSPITAL_COMMUNITY): Payer: Self-pay

## 2020-12-09 MED ORDER — AMPHETAMINE-DEXTROAMPHETAMINE 30 MG PO TABS
30.0000 mg | ORAL_TABLET | Freq: Two times a day (BID) | ORAL | 0 refills | Status: DC
  Filled 2020-12-09: qty 60, 30d supply, fill #0

## 2020-12-09 MED ORDER — AMPHETAMINE-DEXTROAMPHETAMINE 10 MG PO TABS
10.0000 mg | ORAL_TABLET | Freq: Every day | ORAL | 0 refills | Status: DC
  Filled 2020-12-14: qty 30, 30d supply, fill #0

## 2020-12-10 ENCOUNTER — Other Ambulatory Visit (HOSPITAL_COMMUNITY): Payer: Self-pay

## 2020-12-14 ENCOUNTER — Other Ambulatory Visit (HOSPITAL_COMMUNITY): Payer: Self-pay

## 2020-12-17 ENCOUNTER — Other Ambulatory Visit (HOSPITAL_COMMUNITY): Payer: Self-pay

## 2021-01-07 ENCOUNTER — Other Ambulatory Visit (HOSPITAL_COMMUNITY): Payer: Self-pay

## 2021-01-21 ENCOUNTER — Other Ambulatory Visit (HOSPITAL_COMMUNITY): Payer: Self-pay

## 2021-01-22 ENCOUNTER — Other Ambulatory Visit (HOSPITAL_COMMUNITY): Payer: Self-pay

## 2021-02-05 ENCOUNTER — Other Ambulatory Visit (HOSPITAL_COMMUNITY): Payer: Self-pay

## 2021-02-17 ENCOUNTER — Other Ambulatory Visit (HOSPITAL_COMMUNITY): Payer: Self-pay

## 2021-02-17 MED ORDER — TOPIRAMATE ER 25 MG PO CAP24
ORAL_CAPSULE | ORAL | 0 refills | Status: DC
  Filled 2021-02-17: qty 70, 28d supply, fill #0

## 2021-02-18 ENCOUNTER — Other Ambulatory Visit (HOSPITAL_COMMUNITY): Payer: Self-pay

## 2021-02-19 ENCOUNTER — Other Ambulatory Visit (HOSPITAL_COMMUNITY): Payer: Self-pay

## 2021-03-05 ENCOUNTER — Other Ambulatory Visit (HOSPITAL_COMMUNITY): Payer: Self-pay

## 2021-03-05 ENCOUNTER — Encounter (INDEPENDENT_AMBULATORY_CARE_PROVIDER_SITE_OTHER): Payer: Self-pay

## 2021-03-05 IMAGING — US US BREAST*L* LIMITED INC AXILLA
1 series · 9 of 9 positions shown · non-contrast
Comparison: Previous exam(s).

CLINICAL DATA: Screening recall from baseline for a possible left
breast mass.

EXAM:
DIGITAL DIAGNOSTIC LEFT MAMMOGRAM WITH CAD AND TOMO
ULTRASOUND LEFT BREAST

[Series 1: us breast*left* limited inc axilla · 0.06mm/px · 9 of 9 slices shown]
[im 1/9]
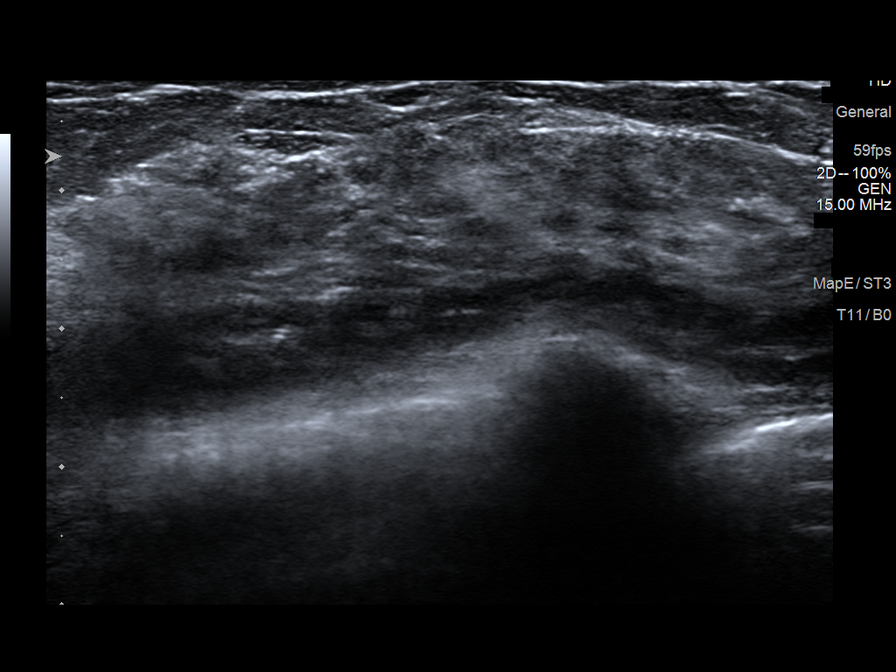
[im 2/9]
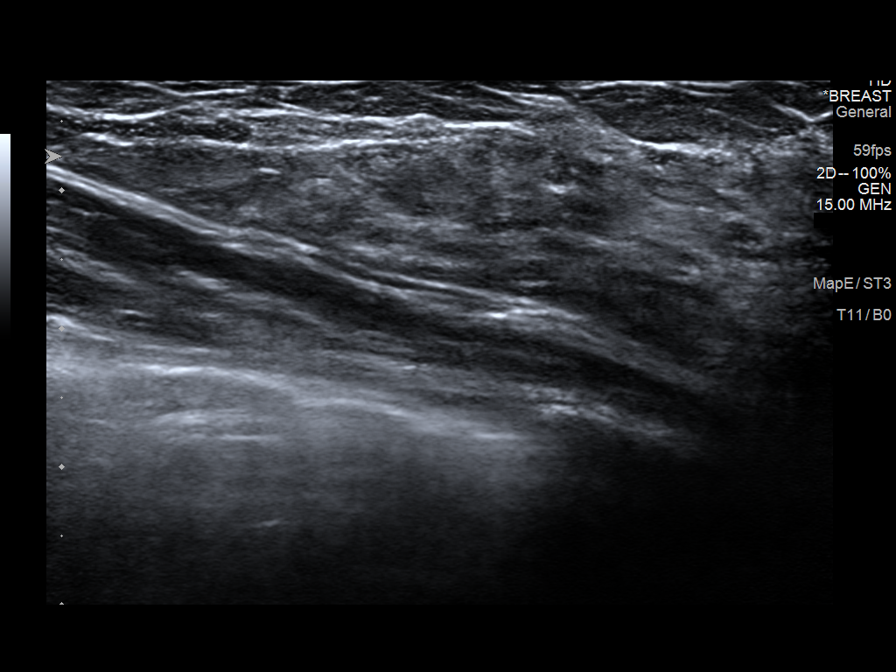
[im 3/9]
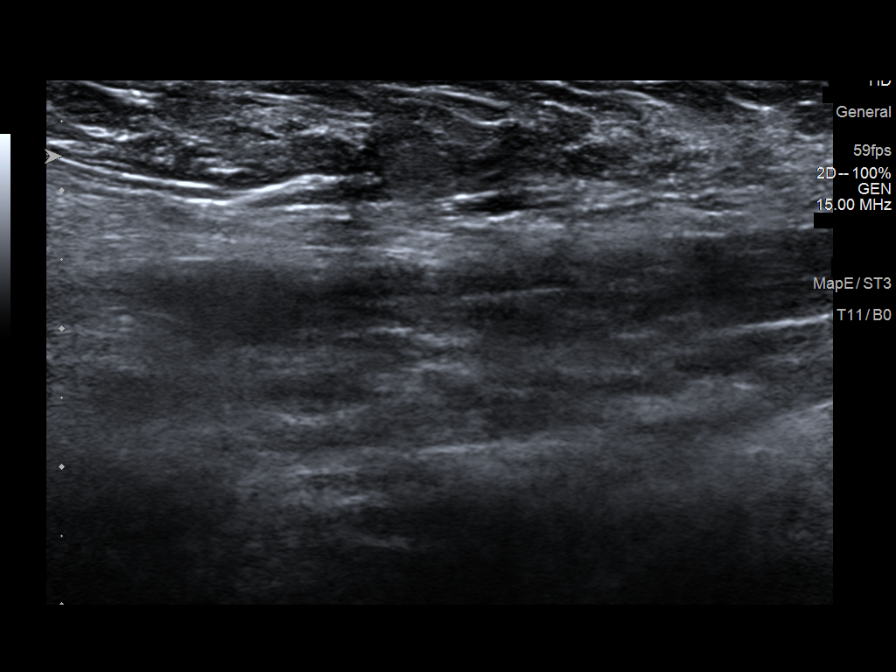
[im 4/9]
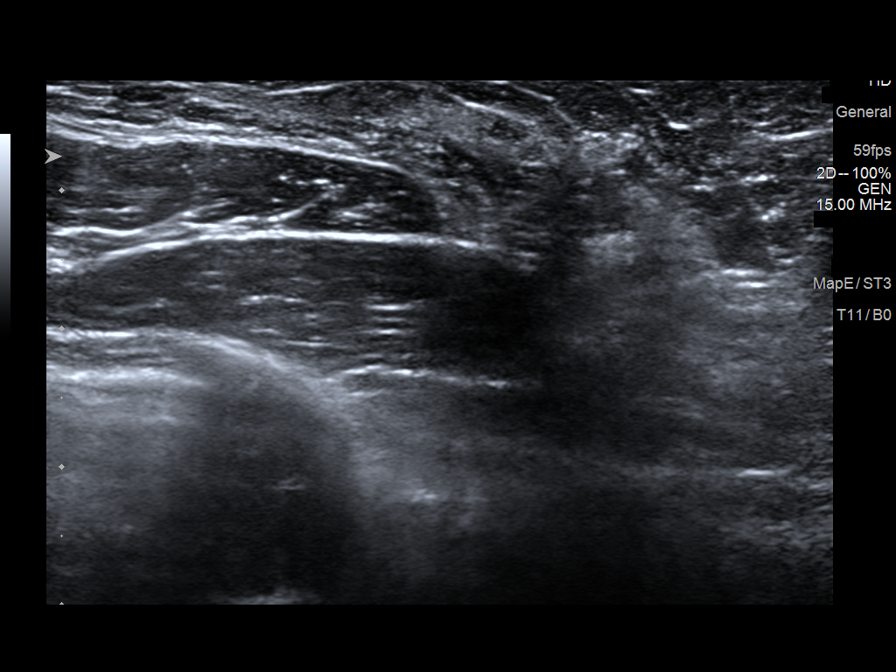
[im 5/9]
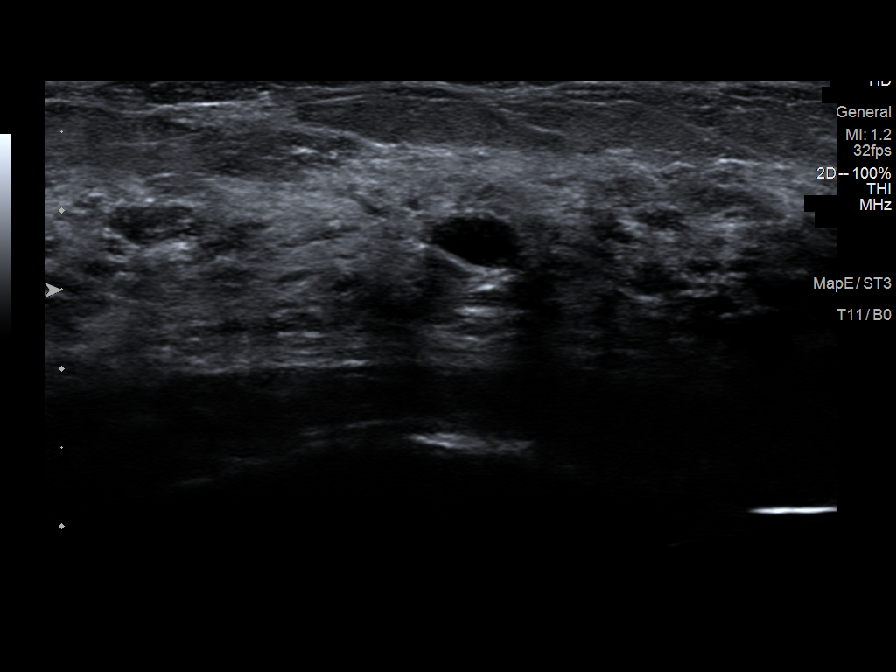
[im 6/9]
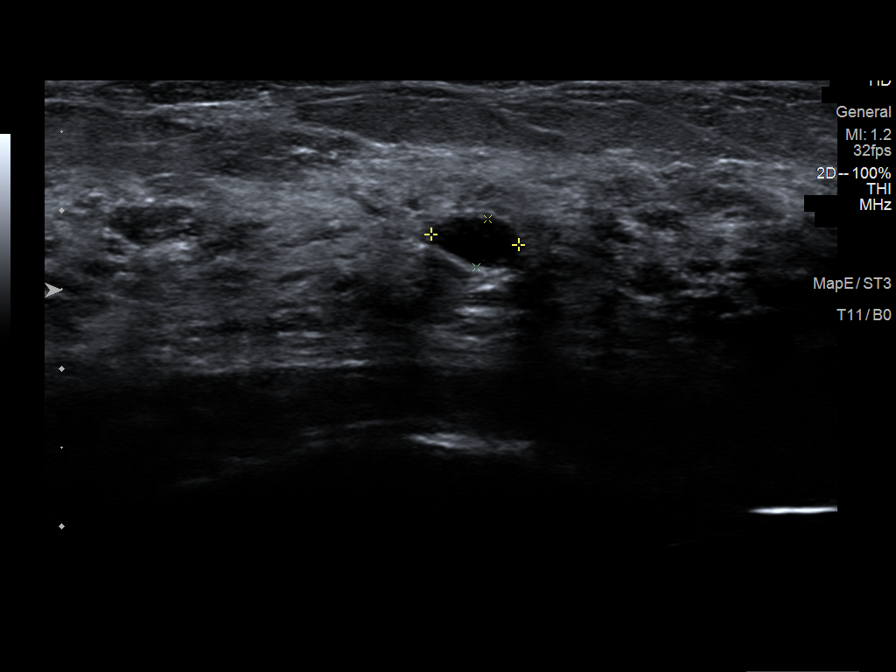
[im 7/9]
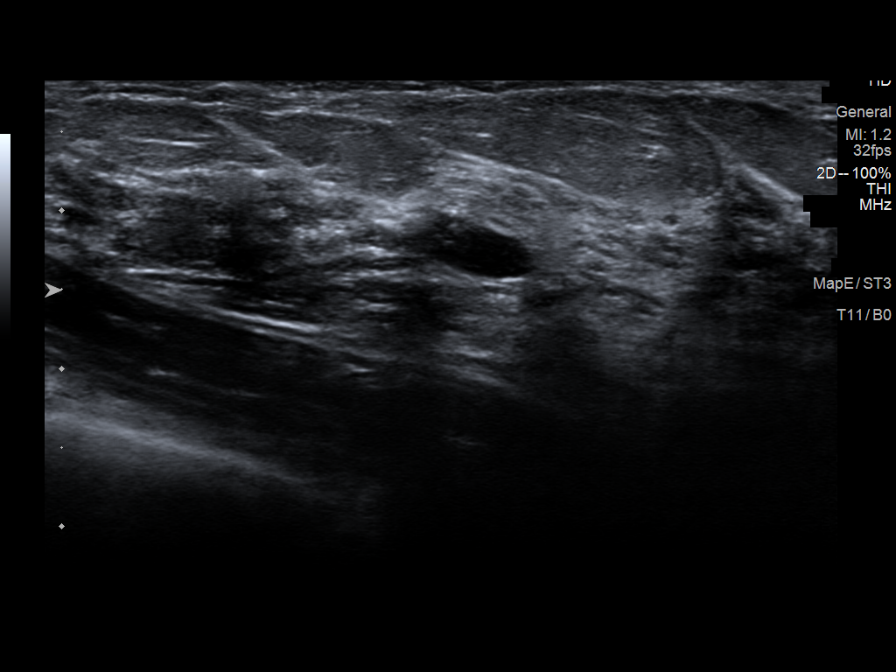
[im 8/9]
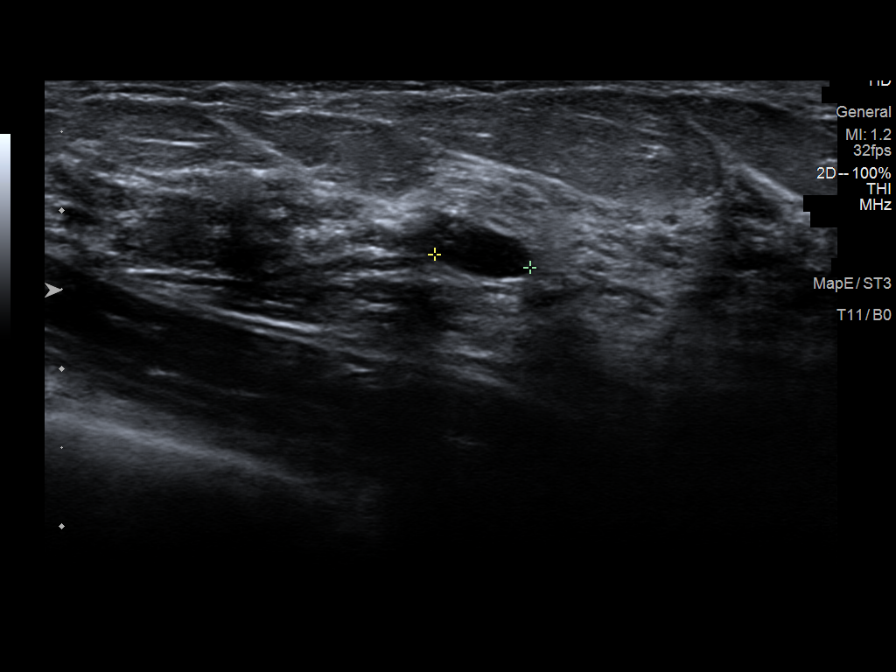
[im 9/9]
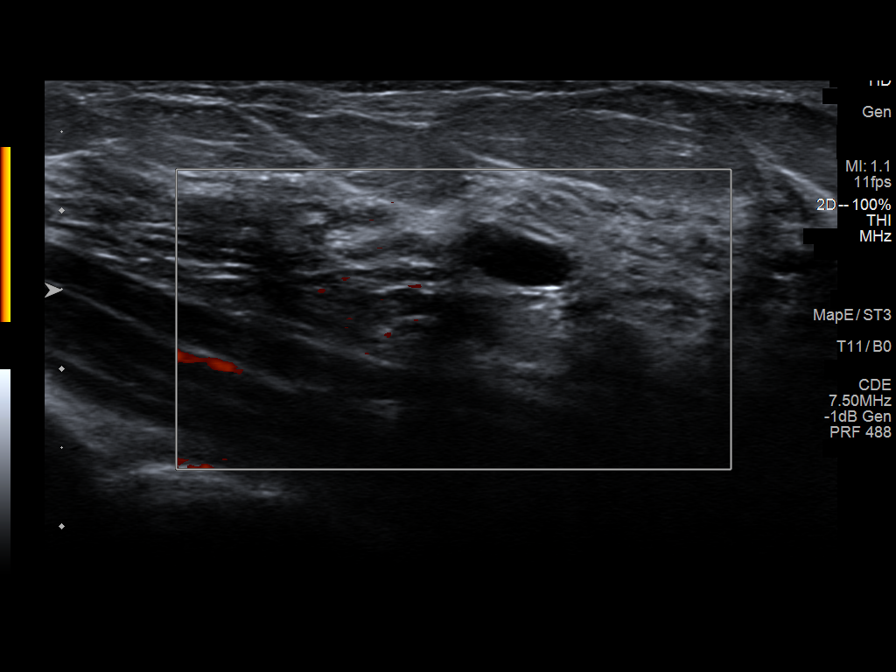

[9 of 9 positions shown; findings below may reference images not displayed]

ACR Breast Density Category d: The breast tissue is extremely dense,
which lowers the sensitivity of mammography.
FINDINGS: Spot compression tomosynthesis images of the upper-outer quadrant of
the left breast demonstrates no definite masses, though the breast
tissue is extremely dense. Tomosynthesis images through the left
axilla demonstrates an asymmetry, with interspersed fat, likely
representing accessory breast tissue.

Mammographic images were processed with CAD.

Ultrasound targeted to the upper-outer quadrant of the left breast
demonstrates normal fibroglandular tissue in the upper-outer
quadrant of the left breast. In the left axillary tail/low axilla,
the patient does have a patch of accessory breast tissue. No masses
are identified. Incidentally noted is a 6 mm benign anechoic cyst in
the left breast at 1 o'clock, 2 cm from the nipple.
IMPRESSION: 1. No mammographic or targeted sonographic abnormalities are seen at
the site of the question mass in the upper-outer left breast.

2. Accessory breast tissue noted in the axillary tail/low left
axilla.

3.  Incidentally noted benign cyst in the left breast at 2 o'clock.

RECOMMENDATION:
Screening mammogram in one year.(Code:58-1-2EL)

I have discussed the findings and recommendations with the patient.
If applicable, a reminder letter will be sent to the patient
regarding the next appointment.

BI-RADS CATEGORY  2: Benign.

## 2021-03-05 MED ORDER — AMPHETAMINE-DEXTROAMPHETAMINE 10 MG PO TABS
ORAL_TABLET | ORAL | 0 refills | Status: DC
  Filled 2021-04-16: qty 30, fill #0
  Filled 2021-05-10: qty 30, 30d supply, fill #0

## 2021-03-05 MED ORDER — AMPHETAMINE-DEXTROAMPHETAMINE 10 MG PO TABS: ORAL_TABLET | ORAL | 0 refills | Status: DC

## 2021-03-05 MED ORDER — AMPHETAMINE-DEXTROAMPHETAMINE 30 MG PO TABS
ORAL_TABLET | ORAL | 0 refills | Status: DC
  Filled 2021-03-05: qty 60, 30d supply, fill #0

## 2021-03-05 MED ORDER — AMPHETAMINE-DEXTROAMPHETAMINE 10 MG PO TABS
ORAL_TABLET | ORAL | 0 refills | Status: DC
  Filled 2021-03-05 – 2021-03-19 (×2): qty 30, 30d supply, fill #0

## 2021-03-05 MED ORDER — AMPHETAMINE-DEXTROAMPHETAMINE 30 MG PO TABS
ORAL_TABLET | ORAL | 0 refills | Status: DC
  Filled 2021-05-12: qty 60, fill #0
  Filled 2021-05-26: qty 60, 30d supply, fill #0

## 2021-03-05 MED ORDER — AMPHETAMINE-DEXTROAMPHETAMINE 30 MG PO TABS
ORAL_TABLET | ORAL | 0 refills | Status: DC
  Filled 2021-04-01: qty 60, 30d supply, fill #0
  Filled 2021-04-02: qty 60, 20d supply, fill #0

## 2021-03-17 ENCOUNTER — Encounter: Payer: Self-pay | Admitting: Internal Medicine

## 2021-03-18 ENCOUNTER — Ambulatory Visit (INDEPENDENT_AMBULATORY_CARE_PROVIDER_SITE_OTHER): Payer: 59 | Admitting: Internal Medicine

## 2021-03-18 ENCOUNTER — Other Ambulatory Visit: Payer: Self-pay

## 2021-03-18 ENCOUNTER — Encounter: Payer: Self-pay | Admitting: Internal Medicine

## 2021-03-18 VITALS — BP 128/88 | HR 97 | Ht 64.0 in | Wt 122.4 lb

## 2021-03-18 DIAGNOSIS — R519 Headache, unspecified: Secondary | ICD-10-CM

## 2021-03-18 DIAGNOSIS — Z833 Family history of diabetes mellitus: Secondary | ICD-10-CM

## 2021-03-18 DIAGNOSIS — G8929 Other chronic pain: Secondary | ICD-10-CM

## 2021-03-18 DIAGNOSIS — E063 Autoimmune thyroiditis: Secondary | ICD-10-CM | POA: Diagnosis not present

## 2021-03-18 DIAGNOSIS — E038 Other specified hypothyroidism: Secondary | ICD-10-CM | POA: Diagnosis not present

## 2021-03-18 DIAGNOSIS — R7309 Other abnormal glucose: Secondary | ICD-10-CM | POA: Diagnosis not present

## 2021-03-18 LAB — T4, FREE: Free T4: 0.73 ng/dL (ref 0.60–1.60)

## 2021-03-18 LAB — HEMOGLOBIN A1C: Hgb A1c MFr Bld: 6.4 % (ref 4.6–6.5)

## 2021-03-18 LAB — TSH: TSH: 2.96 u[IU]/mL (ref 0.35–5.50)

## 2021-03-18 NOTE — Progress Notes (Signed)
Patient ID: Michaela Bryant, female   DOB: 1975/12/19, 46 y.o.   MRN: [id number]  ? ?This visit occurred during the SARS-CoV-2 public health emergency.  Safety protocols were in place, including screening questions prior to the visit, additional usage of staff PPE, and extensive cleaning of exam room while observing appropriate contact time as indicated for disinfecting solutions.  ? ?HPI  ?Michaela Bryant is a 46 y.o.-year-old female, returning for follow-up for Hashimoto's thyroiditis.  Last visit 9 months ago. ? ?Interim history: ?At last visit, she had fatigue, 6 to 8 pounds weight gain and nausea.  We ended up starting levothyroxine low-dose.  However, she did not return for labs afterwards.  She felt well afterwards, and lost 2 pounds.  However, she started to have more headaches last Spring, which improved, but they have reoccurred 2 months ago.  She also has nausea. ?She also has blurry vision.  She has not seen ophthalmology recently. ?She has celiac disease and sees Dr. Collene Mares. ?She has ADHD and is on Adderall. ? ?Reviewed history: ?She was diagnosed with hypothyroidism in 2012.  Her TFTs normalized afterwards so she was not started on levothyroxine.  However, a TSH was again elevated in 12/2018, and normalized again afterwards.   ? ?At our last visit in 05/2019, she described that for the previous month she had to feel more fatigued, gained weight, decreased interest in exercising. No cold intolerance, constipation, hair loss but she had hair breakage.  Symptoms started after Easter, possibly after she ate gluten.  At that time, she also had an episode of anterior neck pressure so she felt that the symptoms may have been related to the thyroid.  We continued selenium but did not start levothyroxine at that time.  Discussed about improving diet.  She did start to eliminate gluten afterwards and she started to feel much better. ? ?We started levothyroxine 07/2020. ? ?Pt is on levothyroxine 25 mcg daily,  taken: ?- in am ?- fasting ?- at least 20 min from b'fast ?- no calcium ?- no iron ?- + multivitamins ?- no PPIs ?- not on Biotin ? ?Reviewed her TFTs: ?Lab Results  ?Component Value Date  ? TSH 2.87 07/01/2020  ? TSH 1.710 05/10/2019  ? TSH 2.74 01/23/2019  ? TSH 7.320 (H) 12/26/2018  ? TSH 2.880 12/25/2017  ? TSH 5.964 (H) 08/04/2010  ? FREET4 0.65 07/01/2020  ? FREET4 0.73 (L) 05/10/2019  ? FREET4 0.81 01/23/2019  ? FREET4 1.12 12/26/2018  ? T3FREE 3.1 07/01/2020  ? T3FREE 2.4 05/10/2019  ? T3FREE 3.3 01/23/2019  ? ?Lab Results  ?Component Value Date  ? T3FREE 3.1 07/01/2020  ? T3FREE 2.4 05/10/2019  ? T3FREE 3.3 01/23/2019  ?  ?Her antithyroid antibodies are elevated: ?Component ?    Latest Ref Rng & Units 07/01/2020  ?Thyroglobulin Ab ?    < or = 1 IU/mL 3 (H)  ?Thyroperoxidase Ab SerPl-aCnc ?    <9 IU/mL 342 (H)  ? ?Component ?    Latest Ref Rng & Units 05/10/2019  ?Thyroperoxidase Ab SerPl-aCnc ?    0 - 34 IU/mL 112 (H)  ?Thyroglobulin Antibody ?    0.0 - 0.9 IU/mL 2.3 (H)  ? ?Component ?    Latest Ref Rng & Units 01/23/2019  ?Thyroglobulin Ab ?    < or = 1 IU/mL 2 (H)  ?Thyroperoxidase Ab SerPl-aCnc ?    <9 IU/mL 138 (H)  ? ?We started selenium 200 mcg daily 01/2019.  She feels better on this and continues it. ? ?Pt denies: ?- feeling nodules in neck ?- hoarseness ?- dysphagia ?- choking ?- SOB with lying down ? ?She had occasional episodes of neck pressure in the past, resolved. ? ?Thyroid ultrasound (01/14/2019): thyroid appeared moderately heterogeneous and potentially hyperemic but it was normal in size and without nodules ? ?She has + FH of thyroid disorders in: MGM - Graves ds, M aunt - goiter. No FH of thyroid cancer. No h/o radiation tx to head or neck. ? ?No herbal supplements. No Biotin use. No recent steroids use.  ? ?He has a history of celiac disease. ?She has a h/o R heel cellulitis and sepsis in 2018. ?Pt. also has a history of ADHD-on Adderall. ? ?She has family history of diabetes in maternal  grandmother ? ?HbA1c was at the [facility]: ?Lab Results  ?Component Value Date  ? HGBA1C 5.6 12/26/2018  ? ?ROS: ? + see HPI ? ?I reviewed pt's medications, allergies, PMH, social hx, family hx, and changes were documented in the history of present illness. Otherwise, unchanged from my initial visit note. ? ?Past Medical History:  ?Diagnosis Date  ? ADD (attention deficit disorder)   ? Celiac disease   ? Depression   ? Pinched vertebral nerve   ? ?No past surgical history on file. ?Social History  ? ?Socioeconomic History  ? Marital status: Divorced  ?  Spouse name: Not on file  ? Number of children: 2  ? Years of education: Not on file  ? Highest education level: Not on file  ?Occupational History  ? Occupation: Secondary school teacher  ?Tobacco Use  ? Smoking status: Never  ? Smokeless tobacco: Never  ?Vaping Use  ? Vaping Use: Never used  ?Substance and Sexual Activity  ? Alcohol use: Yes  ?  Alcohol/week: 3.0 standard drinks  ?  Types: 3 Glasses of wine per week  ? Drug use: No  ? Sexual activity: Not Currently  ?Other Topics Concern  ? Not on file  ?Social History Narrative  ? Not on file  ? ?Social Determinants of Health  ? ?Financial Resource Strain: Not on file  ?Food Insecurity: Not on file  ?Transportation Needs: Not on file  ?Physical Activity: Not on file  ?Stress: Not on file  ?Social Connections: Not on file  ?Intimate Partner Violence: Not on file  ? ?Current Outpatient Medications on File Prior to Visit  ?Medication Sig Dispense Refill  ? amphetamine-dextroamphetamine (ADDERALL) 10 MG tablet 1 tablet by mouth daily 30 tablet 0  ? amphetamine-dextroamphetamine (ADDERALL) 10 MG tablet TAKE 1 TABLET BY MOUTH DAILY 30 tablet 0  ? amphetamine-dextroamphetamine (ADDERALL) 10 MG tablet TAKE 1 TABLET BY MOUTH DAILY AS NEEDED DO NOT FILL UNTIL 01/16/20 30 tablet 0  ? amphetamine-dextroamphetamine (ADDERALL) 10 MG tablet TAKE 1 TABLET BY MOUTH DAILY AS NEEDED 30 tablet 0  ? amphetamine-dextroamphetamine (ADDERALL) 10 MG  tablet TAKE 1 TABLET BY MOUTH DAILY DO NOT FILL UNTIL 12/06/19 30 tablet 0  ? amphetamine-dextroamphetamine (ADDERALL) 10 MG tablet TAKE 1 TABLET BY MOUTH ONCE A DAY AS NEEDED 30 tablet 0  ? amphetamine-dextroamphetamine (ADDERALL) 10 MG tablet Take 1 tablet (10 mg total) by mouth daily (fill 05/08/20) 30 tablet 0  ? amphetamine-dextroamphetamine (ADDERALL) 10 MG tablet Take 1 tablet (10 mg total) by mouth daily as needed (fill 07/03/20) 30 tablet 0  ? amphetamine-dextroamphetamine (ADDERALL) 10 MG tablet Take 1 tablet (10 mg total) by mouth daily (fill 06/05/20) 30 tablet 0  ?  amphetamine-dextroamphetamine (ADDERALL) 10 MG tablet Take 1 tablet (10 mg total) by mouth daily. 30 tablet 0  ? amphetamine-dextroamphetamine (ADDERALL) 10 MG tablet Take 1 tablet (10 mg total) by mouth daily (fill 09/19/20) 30 tablet 0  ? amphetamine-dextroamphetamine (ADDERALL) 10 MG tablet Take 1 tablet (10 mg total) by mouth daily (fill 10/17/20) 30 tablet 0  ? amphetamine-dextroamphetamine (ADDERALL) 10 MG tablet Take 1 tablet by mouth once a day 30 tablet 0  ? amphetamine-dextroamphetamine (ADDERALL) 10 MG tablet Take 1 tablet by mouth once a day * Do not fill until 11/14/20 30 tablet 0  ? amphetamine-dextroamphetamine (ADDERALL) 10 MG tablet Take 1 tablet by mouth daily. 30 tablet 0  ? amphetamine-dextroamphetamine (ADDERALL) 10 MG tablet Take 1 tablet by mouth daily (03-05-21 ) 30 tablet 0  ? amphetamine-dextroamphetamine (ADDERALL) 10 MG tablet Take 1 tablet by mouth daily (Don't Fill until 04-30-21) 30 tablet 0  ? amphetamine-dextroamphetamine (ADDERALL) 10 MG tablet Take 1 tablet by mouth daily (Don't fill until 04-02-21) 30 tablet 0  ? amphetamine-dextroamphetamine (ADDERALL) 30 MG tablet Take 1 tablet by mouth 2 (two) times daily. 60 tablet 0  ? amphetamine-dextroamphetamine (ADDERALL) 30 MG tablet TAKE 1 TABLET BY MOUTH 2 TIMES DAILY 60 tablet 0  ? amphetamine-dextroamphetamine (ADDERALL) 30 MG tablet TAKE 1 TABLET BY MOUTH 2 TIMES DAILY  DO NOT FILL UNTIL 03/12/20 60 tablet 0  ? amphetamine-dextroamphetamine (ADDERALL) 30 MG tablet TAKE 1 TABLET BY MOUTH 2 TIMES DAILY DO NOT FILL UNTIL 05/05/20 60 tablet 0  ? amphetamine-dextroamphetamine (ADD

## 2021-03-18 NOTE — Patient Instructions (Signed)
Please continue Levothyroxine mcg daily. ? ?.Take the thyroid hormone every day, with water, at least 30 minutes before breakfast, separated by at least 4 hours from: ?- acid reflux medications ?- calcium ?- iron ?- multivitamins ? ?Please come back for a follow-up appointment in 6 months. ?

## 2021-03-19 ENCOUNTER — Other Ambulatory Visit (HOSPITAL_COMMUNITY): Payer: Self-pay

## 2021-03-20 ENCOUNTER — Other Ambulatory Visit (HOSPITAL_COMMUNITY): Payer: Self-pay

## 2021-03-26 ENCOUNTER — Other Ambulatory Visit (HOSPITAL_COMMUNITY): Payer: Self-pay

## 2021-04-01 ENCOUNTER — Other Ambulatory Visit (HOSPITAL_COMMUNITY): Payer: Self-pay

## 2021-04-02 ENCOUNTER — Other Ambulatory Visit (HOSPITAL_COMMUNITY): Payer: Self-pay

## 2021-04-16 ENCOUNTER — Other Ambulatory Visit (HOSPITAL_COMMUNITY): Payer: Self-pay

## 2021-04-17 ENCOUNTER — Other Ambulatory Visit (HOSPITAL_COMMUNITY): Payer: Self-pay

## 2021-05-10 ENCOUNTER — Other Ambulatory Visit (HOSPITAL_COMMUNITY): Payer: Self-pay

## 2021-05-10 ENCOUNTER — Other Ambulatory Visit: Payer: Self-pay | Admitting: Internal Medicine

## 2021-05-12 ENCOUNTER — Other Ambulatory Visit (HOSPITAL_COMMUNITY): Payer: Self-pay

## 2021-05-12 MED ORDER — LEVOTHYROXINE SODIUM 25 MCG PO TABS
25.0000 ug | ORAL_TABLET | Freq: Every day | ORAL | 3 refills | Status: DC
Start: 2021-05-12 — End: 2021-09-22
  Filled 2021-05-12: qty 60, 60d supply, fill #0
  Filled 2021-07-24: qty 60, 60d supply, fill #1

## 2021-05-17 ENCOUNTER — Other Ambulatory Visit (HOSPITAL_COMMUNITY): Payer: Self-pay

## 2021-05-26 ENCOUNTER — Other Ambulatory Visit (HOSPITAL_COMMUNITY): Payer: Self-pay

## 2021-06-28 ENCOUNTER — Other Ambulatory Visit (HOSPITAL_COMMUNITY): Payer: Self-pay

## 2021-06-29 ENCOUNTER — Other Ambulatory Visit (HOSPITAL_COMMUNITY): Payer: Self-pay

## 2021-06-29 MED ORDER — AMPHETAMINE-DEXTROAMPHETAMINE 10 MG PO TABS
10.0000 mg | ORAL_TABLET | Freq: Every day | ORAL | 0 refills | Status: DC | PRN
  Filled 2021-06-29: qty 30, 30d supply, fill #0

## 2021-06-29 MED ORDER — AMPHETAMINE-DEXTROAMPHETAMINE 30 MG PO TABS
30.0000 mg | ORAL_TABLET | Freq: Two times a day (BID) | ORAL | 0 refills | Status: DC
  Filled 2021-06-29: qty 60, 30d supply, fill #0

## 2021-07-27 ENCOUNTER — Other Ambulatory Visit (HOSPITAL_COMMUNITY): Payer: Self-pay

## 2021-07-29 ENCOUNTER — Other Ambulatory Visit (HOSPITAL_COMMUNITY): Payer: Self-pay

## 2021-07-29 MED ORDER — AMPHETAMINE-DEXTROAMPHETAMINE 10 MG PO TABS
ORAL_TABLET | ORAL | 0 refills | Status: DC
  Filled 2021-07-29: qty 30, 30d supply, fill #0

## 2021-07-29 MED ORDER — AMPHETAMINE-DEXTROAMPHETAMINE 30 MG PO TABS
ORAL_TABLET | ORAL | 0 refills | Status: DC
  Filled 2021-07-29: qty 60, 30d supply, fill #0

## 2021-07-30 ENCOUNTER — Other Ambulatory Visit (HOSPITAL_COMMUNITY): Payer: Self-pay

## 2021-08-02 ENCOUNTER — Other Ambulatory Visit (HOSPITAL_COMMUNITY): Payer: Self-pay

## 2021-08-03 ENCOUNTER — Encounter: Payer: Self-pay | Admitting: Internal Medicine

## 2021-08-04 ENCOUNTER — Other Ambulatory Visit (HOSPITAL_COMMUNITY): Payer: Self-pay

## 2021-08-11 ENCOUNTER — Other Ambulatory Visit (HOSPITAL_COMMUNITY): Payer: Self-pay

## 2021-08-27 ENCOUNTER — Other Ambulatory Visit (HOSPITAL_COMMUNITY): Payer: Self-pay

## 2021-08-28 ENCOUNTER — Other Ambulatory Visit (HOSPITAL_COMMUNITY): Payer: Self-pay

## 2021-08-30 ENCOUNTER — Other Ambulatory Visit (HOSPITAL_COMMUNITY): Payer: Self-pay

## 2021-08-30 MED ORDER — AMPHETAMINE-DEXTROAMPHETAMINE 30 MG PO TABS
ORAL_TABLET | ORAL | 0 refills | Status: DC
  Filled 2021-08-30: qty 60, 30d supply, fill #0

## 2021-08-30 MED ORDER — AMPHETAMINE-DEXTROAMPHETAMINE 10 MG PO TABS
ORAL_TABLET | ORAL | 0 refills | Status: DC
  Filled 2021-08-30: qty 30, 30d supply, fill #0

## 2021-09-21 ENCOUNTER — Ambulatory Visit: Payer: 59 | Admitting: Internal Medicine

## 2021-09-21 ENCOUNTER — Encounter: Payer: Self-pay | Admitting: Internal Medicine

## 2021-09-21 VITALS — BP 110/62 | HR 103 | Ht 64.0 in | Wt 120.2 lb

## 2021-09-21 DIAGNOSIS — E038 Other specified hypothyroidism: Secondary | ICD-10-CM

## 2021-09-21 DIAGNOSIS — E063 Autoimmune thyroiditis: Secondary | ICD-10-CM

## 2021-09-21 DIAGNOSIS — R7309 Other abnormal glucose: Secondary | ICD-10-CM | POA: Diagnosis not present

## 2021-09-21 LAB — T4, FREE: Free T4: 0.8 ng/dL (ref 0.60–1.60)

## 2021-09-21 LAB — POCT GLYCOSYLATED HEMOGLOBIN (HGB A1C): Hemoglobin A1C: 5.7 % — AB (ref 4.0–5.6)

## 2021-09-21 LAB — HEMOGLOBIN A1C: Hgb A1c MFr Bld: 6.2 % (ref 4.6–6.5)

## 2021-09-21 LAB — TSH: TSH: 1.99 u[IU]/mL (ref 0.35–5.50)

## 2021-09-21 NOTE — Progress Notes (Unsigned)
Patient ID: Michaela Bryant, female   DOB: Jul 20, 1975, 46 y.o.   MRN: [id number]   HPI  Michaela Bryant is a 46 y.o.-year-old female, returning for follow-up for Hashimoto's thyroiditis and elevated HbA1c.  Last visit 6 months ago.  Interim history: At last visit, she had headaches, blurry vision, and some nausea.  She is feeling better but nausea persists.  Also, she has hair loss. She is trying to reduce sugars.  She is feeling better on Levothyroxine.  She noticed this when she was without levothyroxine for 1.5 weeks and she started to feel poorly. She had a 3rd episode of cellulitis due to cracked skin on soles.  She saw rheumatology and dermatology for this without an etiology found.   He is very busy at work.    Reviewed history: She was diagnosed with hypothyroidism in 2012.  Her TFTs normalized afterwards so she was not started on levothyroxine.  However, a TSH was again elevated in 12/2018, and normalized again afterwards.    At our last visit in 05/2019, she described that for the previous month she had to feel more fatigued, gained weight, decreased interest in exercising. No cold intolerance, constipation, hair loss but she had hair breakage.  Symptoms started after Easter, possibly after she ate gluten.  At that time, she also had an episode of anterior neck pressure so she felt that the symptoms may have been related to the thyroid.  We continued selenium but did not start levothyroxine at that time.  Discussed about improving diet.  She did start to eliminate gluten afterwards and she started to feel much better.  We started levothyroxine 07/2020.  Pt is on levothyroxine 25 mcg daily, taken: - in am -since her alarm to take it and then goes back to bed.  She does this because she needs to eat immediately after waking up to take her ADHD medicines. - fasting - at least 30 min from b'fast - no calcium - no iron - + multivitamins later in the day - no PPIs - not on  Biotin  Reviewed her TFTs: Lab Results  Component Value Date   TSH 2.96 03/18/2021   TSH 2.87 07/01/2020   TSH 1.710 05/10/2019   TSH 2.74 01/23/2019   TSH 7.320 (H) 12/26/2018   TSH 2.880 12/25/2017   TSH 5.964 (H) 08/04/2010   FREET4 0.73 03/18/2021   FREET4 0.65 07/01/2020   FREET4 0.73 (L) 05/10/2019   FREET4 0.81 01/23/2019   FREET4 1.12 12/26/2018   T3FREE 3.1 07/01/2020   T3FREE 2.4 05/10/2019   T3FREE 3.3 01/23/2019   Lab Results  Component Value Date   T3FREE 3.1 07/01/2020   T3FREE 2.4 05/10/2019   T3FREE 3.3 01/23/2019    Her antithyroid antibodies are elevated: Component     Latest Ref Rng & Units 07/01/2020  Thyroglobulin Ab     < or = 1 IU/mL 3 (H)  Thyroperoxidase Ab SerPl-aCnc     <9 IU/mL 342 (H)   Component     Latest Ref Rng & Units 05/10/2019  Thyroperoxidase Ab SerPl-aCnc     0 - 34 IU/mL 112 (H)  Thyroglobulin Antibody     0.0 - 0.9 IU/mL 2.3 (H)   Component     Latest Ref Rng & Units 01/23/2019  Thyroglobulin Ab     < or = 1 IU/mL 2 (H)  Thyroperoxidase Ab SerPl-aCnc     <9 IU/mL 138 (H)   We started selenium 200 mcg  daily 01/2019.  She felt better on this and continues it.  Pt denies: - feeling nodules in neck - hoarseness - dysphagia - choking  She had occasional episodes of neck pressure in the past, resolved.  Thyroid ultrasound (01/14/2019): thyroid appeared moderately heterogeneous and potentially hyperemic but it was normal in size and without nodules  She has + FH of thyroid disorders in: MGM - Graves ds, M aunt - goiter. No FH of thyroid cancer. No h/o radiation tx to head or neck.  No herbal supplements. No Biotin use. No recent steroids use.   He has a history of celiac disease.  She sees Dr. Collene Mares. She has a h/o R heel cellulitis and sepsis in 2018. She has cracked skin on soles. Pt. also has a history of ADHD-on Adderall. She has celiac disease and sees Dr. Collene Mares.  As she has a family history of diabetes in maternal  grandmother, at last visit she wanted to be tested for diabetes.  HbA1c was at the [facility]: Lab Results  Component Value Date   HGBA1C 6.4 03/18/2021   HGBA1C 5.6 12/26/2018   ROS:  + see HPI  I reviewed pt's medications, allergies, PMH, social hx, family hx, and changes were documented in the history of present illness. Otherwise, unchanged from my initial visit note.  Past Medical History:  Diagnosis Date   ADD (attention deficit disorder)    Celiac disease    Depression    Pinched vertebral nerve    No past surgical history on file. Social History   Socioeconomic History   Marital status: Divorced    Spouse name: Not on file   Number of children: 2   Years of education: Not on file   Highest education level: Not on file  Occupational History   Occupation: Secondary school teacher  Tobacco Use   Smoking status: Never   Smokeless tobacco: Never  Vaping Use   Vaping Use: Never used  Substance and Sexual Activity   Alcohol use: Yes    Alcohol/week: 3.0 standard drinks of alcohol    Types: 3 Glasses of wine per week   Drug use: No   Sexual activity: Not Currently  Other Topics Concern   Not on file  Social History Narrative   Not on file   Social Determinants of Health   Financial Resource Strain: Not on file  Food Insecurity: Not on file  Transportation Needs: Not on file  Physical Activity: Not on file  Stress: Not on file  Social Connections: Not on file  Intimate Partner Violence: Not on file   Current Outpatient Medications on File Prior to Visit  Medication Sig Dispense Refill   amphetamine-dextroamphetamine (ADDERALL) 10 MG tablet Take 1 tablet by mouth daily 30 tablet 0   amphetamine-dextroamphetamine (ADDERALL) 10 MG tablet Take 1 tablet by mouth daily (Don't Fill until 04-30-21) 30 tablet 0   amphetamine-dextroamphetamine (ADDERALL) 10 MG tablet Take 1 tablet (10 mg total) by mouth daily as needed. 30 tablet 0   amphetamine-dextroamphetamine (ADDERALL) 10 MG  tablet Take 1 tablet by mouth (08/30/2021) once a day - as needed 30 days 30 tablet 0   amphetamine-dextroamphetamine (ADDERALL) 30 MG tablet Take 1 tablet by mouth twice a day  *04/02/21 60 tablet 0   amphetamine-dextroamphetamine (ADDERALL) 30 MG tablet Take 1 tablet by mouth  twice daily (fill 08/30/2021) 60 tablet 0   hydrochlorothiazide (MICROZIDE) 12.5 MG capsule 30TAKE 1 CAPSULE BY MOUTH DAILY IN THE MORNING AS NEEDED FOR SWELLING/FLUID 30 capsule  0   levothyroxine (SYNTHROID) 25 MCG tablet Take 1 tablet (25 mcg total) by mouth daily. 60 tablet 3   Multiple Vitamin (MULTIVITAMIN) capsule Take 1 capsule by mouth daily.     ondansetron (ZOFRAN) 4 MG tablet Take 1 tablet by mouth every 6 hours as needed, 1 hour before a meal 40 tablet 0   selenium 200 MCG TABS tablet Take 200 mcg by mouth daily.     Topiramate ER (TROKENDI XR) 100 MG CP24 Take 1 capsule by mouth once a day 30 capsule 3   traZODone (DESYREL) 50 MG tablet Take 0.5-1 tablets (25-50 mg total) by mouth at bedtime as needed for sleep. 30 tablet 3   No current facility-administered medications on file prior to visit.   Allergies  Allergen Reactions   Gluten Meal Nausea Only   Family History  Problem Relation Age of Onset   Alcohol abuse Mother    Cancer Mother        breast   Alcohol abuse Maternal Aunt    Alcohol abuse Maternal Uncle    Diabetes Maternal Grandmother    Hypertension Maternal Grandmother    Alcohol abuse Maternal Grandfather    Hypertension Maternal Grandfather    Stroke Paternal Grandfather    PE: BP 110/62 (BP Location: Right Arm, Patient Position: Sitting, Cuff Size: Normal)   Pulse (!) 103   Ht 5' 4"  (1.626 m)   Wt 120 lb 3.2 oz (54.5 kg)   SpO2 98%   BMI 20.63 kg/m  Wt Readings from Last 3 Encounters:  09/21/21 120 lb 3.2 oz (54.5 kg)  03/18/21 122 lb 6.4 oz (55.5 kg)  07/01/20 124 lb (56.2 kg)   Constitutional: normal weight, in NAD Eyes: EOMI, no exophthalmos ENT: no thyromegaly, no  cervical lymphadenopathy Cardiovascular: tachycardia, RR, No MRG Respiratory: CTA B Musculoskeletal: no deformities Skin: moist, warm, no rashes Neurological: no tremor with outstretched hands  ASSESSMENT: 1.  Hashimoto's hypothyroidism  2.  LA reviewed HbA1c  PLAN:  1. Patient with history of elevated TSH in the past, with high TPO and ATA antibodies, indicative of Hashimoto's thyroiditis. -Started selenium 200 mcg daily, which she continues today. - latest thyroid labs reviewed with pt. >> normal: Lab Results  Component Value Date   TSH 2.96 03/18/2021  - she continues on LT4 25 mcg daily - pt feels good on this dose.  She did not realize that she feels better on this dose until she ran out of the prescription and was without it for 1.5 months approximately 2 months ago. - we discussed about taking the thyroid hormone every day, with water, >30 minutes before breakfast, separated by >4 hours from acid reflux medications, calcium, iron, multivitamins. Pt. is taking it correctly. - will check thyroid tests today: TSH and fT4 - If labs are abnormal, she will need to return for repeat TFTs in 1.5 months -I will see her back in 8 months but possibly sooner for labs  2.  Elevated HbA1c -she had 1 HbA1c in the higher prediabetic range at last visit -Family history of diabetes in maternal grandmother -HbA1c was high in the normal range 2 years ago but she was eating more sweets than, during the pandemic. -She denies increased urination, increased thirst and hunger -we discussed at last visit about reducing fat, concentrated sweets, and increasing fiber in her diet.  She is listening to Dr. Mauro Kaufmann and has reduced sweets. -HbA1c (point-of-care) obtained today was much better, at 5.7% -We will also check  a venous HbA1c today  Needs refills.  Philemon Kingdom, MD PhD [facility]

## 2021-09-21 NOTE — Patient Instructions (Addendum)
Please continue Levothyroxine 25 mcg daily.  .Take the thyroid hormone every day, with water, at least 30 minutes before breakfast, separated by at least 4 hours from: - acid reflux medications - calcium - iron - multivitamins  Please stop at the lab.  Please come back for a follow-up appointment in 1 year but with labs at 6 months.

## 2021-09-22 ENCOUNTER — Other Ambulatory Visit (HOSPITAL_COMMUNITY): Payer: Self-pay

## 2021-09-22 MED ORDER — LEVOTHYROXINE SODIUM 25 MCG PO TABS
25.0000 ug | ORAL_TABLET | Freq: Every day | ORAL | 3 refills | Status: DC
  Filled 2021-09-22: qty 90, 90d supply, fill #0
  Filled 2021-12-28: qty 90, 90d supply, fill #1
  Filled 2022-01-21: qty 30, 30d supply, fill #1
  Filled 2022-03-07: qty 30, 30d supply, fill #2
  Filled 2022-04-15: qty 30, 30d supply, fill #3
  Filled 2022-05-23: qty 30, 30d supply, fill #4
  Filled 2022-06-25: qty 30, 30d supply, fill #5
  Filled 2022-08-05: qty 30, 30d supply, fill #6
  Filled 2022-09-21: qty 30, 30d supply, fill #7

## 2021-09-24 ENCOUNTER — Other Ambulatory Visit (HOSPITAL_COMMUNITY): Payer: Self-pay

## 2021-09-24 MED ORDER — AMPHETAMINE-DEXTROAMPHETAMINE 10 MG PO TABS
10.0000 mg | ORAL_TABLET | Freq: Every day | ORAL | 0 refills | Status: DC | PRN
  Filled 2021-09-28: qty 30, 30d supply, fill #0

## 2021-09-24 MED ORDER — AMPHETAMINE-DEXTROAMPHETAMINE 30 MG PO TABS
30.0000 mg | ORAL_TABLET | Freq: Two times a day (BID) | ORAL | 0 refills | Status: DC
  Filled 2021-09-28: qty 60, 30d supply, fill #0

## 2021-09-28 ENCOUNTER — Other Ambulatory Visit (HOSPITAL_COMMUNITY): Payer: Self-pay

## 2021-10-12 ENCOUNTER — Other Ambulatory Visit: Payer: Self-pay

## 2021-10-12 ENCOUNTER — Emergency Department (HOSPITAL_COMMUNITY): Payer: 59

## 2021-10-12 ENCOUNTER — Emergency Department (HOSPITAL_COMMUNITY)
Admission: EM | Admit: 2021-10-12 | Discharge: 2021-10-13 | Disposition: A | Discharge status: Home / Self Care | Payer: 59 | Attending: Emergency Medicine | Admitting: Emergency Medicine

## 2021-10-12 ENCOUNTER — Encounter (HOSPITAL_COMMUNITY): Payer: Self-pay | Admitting: Emergency Medicine

## 2021-10-12 DIAGNOSIS — N23 Unspecified renal colic: Secondary | ICD-10-CM

## 2021-10-12 DIAGNOSIS — M5137 Other intervertebral disc degeneration, lumbosacral region: Secondary | ICD-10-CM | POA: Diagnosis not present

## 2021-10-12 DIAGNOSIS — R1032 Left lower quadrant pain: Secondary | ICD-10-CM | POA: Diagnosis not present

## 2021-10-12 DIAGNOSIS — K828 Other specified diseases of gallbladder: Secondary | ICD-10-CM | POA: Diagnosis not present

## 2021-10-12 DIAGNOSIS — R109 Unspecified abdominal pain: Secondary | ICD-10-CM | POA: Diagnosis not present

## 2021-10-12 DIAGNOSIS — N1330 Unspecified hydronephrosis: Secondary | ICD-10-CM | POA: Diagnosis not present

## 2021-10-12 DIAGNOSIS — N3289 Other specified disorders of bladder: Secondary | ICD-10-CM | POA: Diagnosis not present

## 2021-10-12 DIAGNOSIS — N132 Hydronephrosis with renal and ureteral calculous obstruction: Secondary | ICD-10-CM | POA: Diagnosis not present

## 2021-10-12 LAB — CBC WITH DIFFERENTIAL/PLATELET
Abs Immature Granulocytes: 0.01 10*3/uL (ref 0.00–0.07)
Basophils Absolute: 0.1 10*3/uL (ref 0.0–0.1)
Basophils Relative: 1 %
Eosinophils Absolute: 0.1 10*3/uL (ref 0.0–0.5)
Eosinophils Relative: 2 %
HCT: 39.8 % (ref 36.0–46.0)
Hemoglobin: 12.8 g/dL (ref 12.0–15.0)
Immature Granulocytes: 0 %
Lymphocytes Relative: 25 %
Lymphs Abs: 1.5 10*3/uL (ref 0.7–4.0)
MCH: 25.2 pg — ABNORMAL LOW (ref 26.0–34.0)
MCHC: 32.2 g/dL (ref 30.0–36.0)
MCV: 78.5 fL — ABNORMAL LOW (ref 80.0–100.0)
Monocytes Absolute: 0.4 10*3/uL (ref 0.1–1.0)
Monocytes Relative: 7 %
Neutro Abs: 4.1 10*3/uL (ref 1.7–7.7)
Neutrophils Relative %: 65 %
Platelets: 349 10*3/uL (ref 150–400)
RBC: 5.07 MIL/uL (ref 3.87–5.11)
RDW: 18.5 % — ABNORMAL HIGH (ref 11.5–15.5)
WBC: 6.2 10*3/uL (ref 4.0–10.5)
nRBC: 0 % (ref 0.0–0.2)

## 2021-10-12 LAB — URINALYSIS, ROUTINE W REFLEX MICROSCOPIC
Bacteria, UA: NONE SEEN
Bilirubin Urine: NEGATIVE
Glucose, UA: NEGATIVE mg/dL
Hgb urine dipstick: NEGATIVE
Ketones, ur: 80 mg/dL — AB
Nitrite: NEGATIVE
Protein, ur: NEGATIVE mg/dL
Specific Gravity, Urine: 1.021 (ref 1.005–1.030)
pH: 5 (ref 5.0–8.0)

## 2021-10-12 LAB — I-STAT BETA HCG BLOOD, ED (MC, WL, AP ONLY): I-stat hCG, quantitative: <5 m[IU]/mL (ref <5)

## 2021-10-12 LAB — LIPASE, BLOOD: Lipase: 28 U/L (ref 11–51)

## 2021-10-12 LAB — COMPREHENSIVE METABOLIC PANEL
ALT: 13 U/L (ref 0–44)
AST: 21 U/L (ref 15–41)
Albumin: 4 g/dL (ref 3.5–5.0)
Alkaline Phosphatase: 54 U/L (ref 38–126)
Anion gap: 6 (ref 5–15)
BUN: 19 mg/dL (ref 6–20)
CO2: 26 mmol/L (ref 22–32)
Calcium: 8.8 mg/dL — ABNORMAL LOW (ref 8.9–10.3)
Chloride: 107 mmol/L (ref 98–111)
Creatinine, Ser: 0.7 mg/dL (ref 0.44–1.00)
GFR, Estimated: >60 mL/min (ref >60)
Glucose, Bld: 100 mg/dL — ABNORMAL HIGH (ref 70–99)
Potassium: 3.8 mmol/L (ref 3.5–5.1)
Sodium: 139 mmol/L (ref 135–145)
Total Bilirubin: 0.7 mg/dL (ref 0.3–1.2)
Total Protein: 6.8 g/dL (ref 6.5–8.1)

## 2021-10-12 MED ORDER — IBUPROFEN 800 MG PO TABS: 800.0000 mg | ORAL_TABLET | Freq: Three times a day (TID) | ORAL | 0 refills | Status: DC

## 2021-10-12 MED ORDER — SODIUM CHLORIDE 0.9 % IV BOLUS
1000.0000 mL | Freq: Once | INTRAVENOUS | Status: AC
  Administered 2021-10-12: 1000 mL via INTRAVENOUS

## 2021-10-12 MED ORDER — OXYCODONE-ACETAMINOPHEN 5-325 MG PO TABS: 1.0000 | ORAL_TABLET | Freq: Four times a day (QID) | ORAL | 0 refills | Status: DC | PRN

## 2021-10-12 MED ORDER — OXYCODONE-ACETAMINOPHEN 5-325 MG PO TABS: 1.0000 | ORAL_TABLET | Freq: Once | ORAL | Status: DC

## 2021-10-12 MED ORDER — KETOROLAC TROMETHAMINE 30 MG/ML IJ SOLN
30.0000 mg | Freq: Once | INTRAMUSCULAR | Status: AC
  Administered 2021-10-12: 30 mg via INTRAVENOUS
  Filled 2021-10-12: qty 1

## 2021-10-12 MED ORDER — HYDROMORPHONE HCL 1 MG/ML IJ SOLN
1.0000 mg | Freq: Once | INTRAMUSCULAR | Status: AC
  Administered 2021-10-12: 1 mg via INTRAVENOUS
  Filled 2021-10-12: qty 1

## 2021-10-12 MED ORDER — MORPHINE SULFATE (PF) 4 MG/ML IV SOLN
4.0000 mg | Freq: Once | INTRAVENOUS | Status: AC
  Administered 2021-10-12: 4 mg via INTRAVENOUS
  Filled 2021-10-12: qty 1

## 2021-10-12 MED ORDER — HYDROCODONE-ACETAMINOPHEN 5-325 MG PO TABS
1.0000 | ORAL_TABLET | Freq: Once | ORAL | Status: AC
  Administered 2021-10-12: 1 via ORAL
  Filled 2021-10-12: qty 1

## 2021-10-12 MED ORDER — TAMSULOSIN HCL 0.4 MG PO CAPS: 0.4000 mg | ORAL_CAPSULE | Freq: Every day | ORAL | 0 refills | Status: DC

## 2021-10-12 NOTE — Discharge Instructions (Addendum)
Take motrin for pain   Take percocet for severe pain   Take flomax daily   Stay hydrated   See urology for follow up. Their address is [place] and phone number is [phone]  Return to ER if you have worse abdominal pain, flank pain, vomiting

## 2021-10-12 NOTE — ED Provider Triage Note (Signed)
Emergency Medicine Provider Triage Evaluation Note  Ramata Strothman , a 46 y.o. female  was evaluated in triage.  Pt complains of left-sided flank pain.  Patient states symptoms began insidiously approximately hour and a half ago when she was getting into her car.  Denies history of kidney stone but states she has had complicated ovarian cyst that had been drained approximately 10 years ago.  She reports persistence of pain since onset.  Associated feelings of nausea.  States it feels somewhat similar to prior ovarian cyst and describes pain as menstrual type cramping.  Denies urinary symptoms, hematuria, fever, chills, night sweats, vaginal discharge/bleeding.  Last menstrual period was approximately 2 weeks ago..  Review of Systems  Positive: See above Negative:   Physical Exam  BP (!) 164/107 (BP Location: Right Arm)   Pulse 77   Temp 97.7 F (36.5 C) (Oral)   Resp 18   Ht 5' 4"  (1.626 m)   Wt 52.2 kg   LMP 09/28/2021 (Approximate)   SpO2 100%   BMI 19.74 kg/m  Gen:   Awake, no distress   Resp:  Normal effort  MSK:   Moves extremities without difficulty  Other:  Left lower quadrant/pelvic tenderness.  Left-sided CVA tenderness.  Medical Decision Making  Medically screening exam initiated at 7:55 PM.  Appropriate orders placed.  Lee-Ann [place] was informed that the remainder of the evaluation will be completed by another provider, this initial triage assessment does not replace that evaluation, and the importance of remaining in the ED until their evaluation is complete.     Wilnette Kales, [place] 10/12/21 1956

## 2021-10-12 NOTE — ED Provider Notes (Signed)
[facility] DEPT Provider Note   CSN: [id number] Arrival date & time: 10/12/21  1837     History  Chief Complaint  Patient presents with   Flank Pain    Michaela Bryant is a 46 y.o. female here presenting with left flank pain.  Patient has acute onset of left flank pain started around an hour prior to arrival.  Patient states that no position was comfortable.  Patient states that the pain radiates to the left lower quadrant.  Patient states that she has a history of ovarian cyst that required surgery.  However this feels different than that.  She states that she has never had a history of kidney stones.  Denies any dysuria.  Patient felt nauseated.  The history is provided by the patient.       Home Medications Prior to Admission medications   Medication Sig Start Date End Date Taking? Authorizing Provider  amphetamine-dextroamphetamine (ADDERALL) 10 MG tablet Take 1 tablet (10 mg total) by mouth daily as needed. 09/24/21     amphetamine-dextroamphetamine (ADDERALL) 30 MG tablet Take 1 tablet by mouth 2 (two) times daily. 09/24/21     hydrochlorothiazide (MICROZIDE) 12.5 MG capsule 30TAKE 1 CAPSULE BY MOUTH DAILY IN THE MORNING AS NEEDED FOR SWELLING/FLUID 02/04/19   Danford, Valetta Fuller D, NP  levothyroxine (SYNTHROID) 25 MCG tablet Take 1 tablet (25 mcg total) by mouth daily. 09/22/21   Philemon Kingdom, MD  Multiple Vitamin (MULTIVITAMIN) capsule Take 1 capsule by mouth daily.    [provider]  ondansetron (ZOFRAN) 4 MG tablet Take 1 tablet by mouth every 6 hours as needed, 1 hour before a meal 11/10/20     selenium 200 MCG TABS tablet Take 200 mcg by mouth daily.    [provider]  Topiramate ER (TROKENDI XR) 100 MG CP24 Take 1 capsule by mouth once a day 09/08/20     traZODone (DESYREL) 50 MG tablet Take 0.5-1 tablets (25-50 mg total) by mouth at bedtime as needed for sleep. 03/01/18   Danford, Valetta Fuller D, NP      Allergies    Gluten meal     Review of Systems   Review of Systems  Genitourinary:  Positive for flank pain.  All other systems reviewed and are negative.   Physical Exam Updated Vital Signs BP (!) 164/107 (BP Location: Right Arm)   Pulse 77   Temp 97.7 F (36.5 C) (Oral)   Resp 18   Ht 5' 4"  (1.626 m)   Wt 52.2 kg   LMP 09/28/2021 (Approximate)   SpO2 100%   BMI 19.74 kg/m  Physical Exam Vitals and nursing note reviewed.  Constitutional:      Comments: Uncomfortable, writhing in pain  HENT:     Head: Normocephalic.     Nose: Nose normal.     Mouth/Throat:     Mouth: Mucous membranes are dry.  Eyes:     Extraocular Movements: Extraocular movements intact.     Pupils: Pupils are equal, round, and reactive to light.  Cardiovascular:     Rate and Rhythm: Normal rate and regular rhythm.     Pulses: Normal pulses.     Heart sounds: Normal heart sounds.  Pulmonary:     Effort: Pulmonary effort is normal.     Breath sounds: Normal breath sounds.  Abdominal:     Comments: + L CVAT, mild LLQ tenderness   Musculoskeletal:        General: Normal range of motion.  Cervical back: Normal range of motion and neck supple.  Skin:    General: Skin is warm.     Capillary Refill: Capillary refill takes less than 2 seconds.  Neurological:     General: No focal deficit present.     Mental Status: She is alert and oriented to person, place, and time.  Psychiatric:        Mood and Affect: Mood normal.        Behavior: Behavior normal.     ED Results / Procedures / Treatments   Labs (all labs ordered are listed, but only abnormal results are displayed) Labs Reviewed  COMPREHENSIVE METABOLIC PANEL  CBC WITH DIFFERENTIAL/PLATELET  URINALYSIS, ROUTINE W REFLEX MICROSCOPIC  LIPASE, BLOOD  I-STAT BETA HCG BLOOD, ED ([facility], [facility], [facility] ONLY)    EKG None  Radiology No results found.  Procedures Procedures    Medications Ordered in ED Medications  HYDROcodone-acetaminophen (NORCO/VICODIN) 5-325 MG per  tablet 1 tablet (1 tablet Oral Given 10/12/21 2005)  sodium chloride 0.9 % bolus 1,000 mL (1,000 mLs Intravenous New Bag/Given 10/12/21 2043)  morphine (PF) 4 MG/ML injection 4 mg (4 mg Intravenous Given 10/12/21 2042)    ED Course/ Medical Decision Making/ A&P                           Medical Decision Making Jaydn Fincher Tommy Medal is a 46 y.o. female here presenting with left flank pain and left lower quadrant pain.  Concern for possible renal colic versus ovarian torsion.  Plan to get ovarian ultrasound and also renal ultrasound.  Patient will also need blood work and urinalysis and possibly CT renal stone.  We will give pain medicine and reassess.  11:15 PM I reviewed patient's labs and independently interpreted imaging studies.  Patient has a small 2.5 cm left corpus luteum cyst but there is no torsion.  Patient however does have hydronephrosis on the left.  CT scan showed 2 mm stone in the left UVJ.  Patient's creatinine is normal and her urinalysis do not show any obvious UTI.  Pain is now controlled.  Patient will be discharged home with pain medicine, Flomax, urology follow-up.  Problems Addressed: Renal colic on left side: acute illness or injury  Amount and/or Complexity of Data Reviewed Labs: ordered. Decision-making details documented in ED Course. Radiology: ordered and independent interpretation performed. Decision-making details documented in ED Course.  Risk Prescription drug management.   Final Clinical Impression(s) / ED Diagnoses Final diagnoses:  None    Rx / DC Orders ED Discharge Orders     None         Drenda Freeze, MD 10/12/21 2316

## 2021-10-12 NOTE — ED Triage Notes (Signed)
Pt reports sudden onset of left sided flank pain with nausea, denies v/d/fevers; denies hx of kidney stones but does have hx of ovarian cysts

## 2021-10-13 ENCOUNTER — Other Ambulatory Visit (HOSPITAL_COMMUNITY): Payer: Self-pay

## 2021-10-13 MED ORDER — PROMETHAZINE HCL 25 MG PO TABS
25.0000 mg | ORAL_TABLET | Freq: Once | ORAL | Status: AC
  Administered 2021-10-13: 25 mg via ORAL
  Filled 2021-10-13: qty 1

## 2021-10-13 MED ORDER — ONDANSETRON 4 MG PO TBDP
4.0000 mg | ORAL_TABLET | Freq: Once | ORAL | Status: AC
  Administered 2021-10-13: 4 mg via ORAL
  Filled 2021-10-13: qty 1

## 2021-10-13 MED ORDER — PROMETHAZINE HCL 25 MG PO TABS: 25.0000 mg | ORAL_TABLET | Freq: Three times a day (TID) | ORAL | 0 refills | Status: DC | PRN

## 2021-10-13 NOTE — ED Provider Notes (Signed)
Patient pending discharge following pain medications, her pain was very well controlled, however she subsequently became nauseated therefore I was asked to address this by nursing staff.   Chart was reviewed. Patient with 2 mm UVJ stone. Received analgesics in the ED.  We will give oral Zofran as patient no longer has IV access.  Following Zofran patient with continued nausea with an episode of emesis.  On my reassessment patient states that typically Phenergan works best for her.  We discussed several options of care at this time, we specifically discussed the option of administration of this in the emergency department with further observation in the ED for PO trial to ensure she is able to tolerate PO, however she would really like to go home, she feels comfortable with taking PO Phenergan in the ED with plan for discharge home and a prescription of this being sent to the pharmacy which I feel is reasonable. Discussed return precautions.    Amaryllis Dyke, PA-C 10/13/21 0120    Merryl Hacker, MD 10/13/21 [phone]

## 2021-10-25 ENCOUNTER — Other Ambulatory Visit (HOSPITAL_COMMUNITY): Payer: Self-pay

## 2021-10-26 ENCOUNTER — Other Ambulatory Visit (HOSPITAL_COMMUNITY): Payer: Self-pay

## 2021-10-26 MED ORDER — AMPHETAMINE-DEXTROAMPHETAMINE 30 MG PO TABS
30.0000 mg | ORAL_TABLET | Freq: Every day | ORAL | 0 refills | Status: DC
  Filled 2021-10-26: qty 60, 30d supply, fill #0

## 2021-10-26 MED ORDER — AMPHETAMINE-DEXTROAMPHETAMINE 10 MG PO TABS
10.0000 mg | ORAL_TABLET | Freq: Every day | ORAL | 0 refills | Status: DC
  Filled 2021-10-26: qty 30, 30d supply, fill #0

## 2021-11-11 ENCOUNTER — Other Ambulatory Visit (HOSPITAL_COMMUNITY): Payer: Self-pay

## 2021-11-11 DIAGNOSIS — F909 Attention-deficit hyperactivity disorder, unspecified type: Secondary | ICD-10-CM | POA: Diagnosis not present

## 2021-11-11 DIAGNOSIS — Z23 Encounter for immunization: Secondary | ICD-10-CM | POA: Diagnosis not present

## 2021-11-11 MED ORDER — AMPHETAMINE-DEXTROAMPHETAMINE 10 MG PO TABS
10.0000 mg | ORAL_TABLET | Freq: Every day | ORAL | 0 refills | Status: DC
  Filled 2022-01-21 – 2022-01-22 (×2): qty 30, 30d supply, fill #0

## 2021-11-11 MED ORDER — AMPHETAMINE-DEXTROAMPHETAMINE 10 MG PO TABS
10.0000 mg | ORAL_TABLET | Freq: Every day | ORAL | 0 refills | Status: AC
  Filled 2021-11-23: qty 30, 30d supply, fill #0

## 2021-11-11 MED ORDER — AMPHETAMINE-DEXTROAMPHETAMINE 10 MG PO TABS
10.0000 mg | ORAL_TABLET | Freq: Every day | ORAL | 0 refills | Status: AC
  Filled 2021-12-22 – 2021-12-23 (×2): qty 30, 30d supply, fill #0

## 2021-11-11 MED ORDER — AMPHETAMINE-DEXTROAMPHETAMINE 30 MG PO TABS
30.0000 mg | ORAL_TABLET | Freq: Two times a day (BID) | ORAL | 0 refills | Status: AC
  Filled 2021-11-23: qty 60, 30d supply, fill #0

## 2021-11-11 MED ORDER — AMPHETAMINE-DEXTROAMPHETAMINE 30 MG PO TABS
30.0000 mg | ORAL_TABLET | Freq: Two times a day (BID) | ORAL | 0 refills | Status: DC
  Filled 2022-01-21 – 2022-01-22 (×2): qty 60, 30d supply, fill #0

## 2021-11-11 MED ORDER — AMPHETAMINE-DEXTROAMPHETAMINE 30 MG PO TABS
30.0000 mg | ORAL_TABLET | Freq: Two times a day (BID) | ORAL | 0 refills | Status: AC
  Filled 2021-12-22 – 2021-12-23 (×2): qty 60, 30d supply, fill #0

## 2021-11-22 ENCOUNTER — Other Ambulatory Visit (HOSPITAL_COMMUNITY): Payer: Self-pay

## 2021-11-23 ENCOUNTER — Other Ambulatory Visit (HOSPITAL_COMMUNITY): Payer: Self-pay

## 2021-12-13 ENCOUNTER — Encounter: Payer: Self-pay | Admitting: Internal Medicine

## 2021-12-22 ENCOUNTER — Other Ambulatory Visit (HOSPITAL_COMMUNITY): Payer: Self-pay

## 2021-12-23 ENCOUNTER — Other Ambulatory Visit: Payer: Self-pay

## 2021-12-23 ENCOUNTER — Other Ambulatory Visit (HOSPITAL_COMMUNITY): Payer: Self-pay

## 2021-12-28 ENCOUNTER — Other Ambulatory Visit: Payer: Self-pay

## 2022-01-11 ENCOUNTER — Other Ambulatory Visit (HOSPITAL_COMMUNITY): Payer: Self-pay

## 2022-01-21 ENCOUNTER — Other Ambulatory Visit (HOSPITAL_COMMUNITY): Payer: Self-pay

## 2022-01-21 ENCOUNTER — Other Ambulatory Visit: Payer: Self-pay

## 2022-01-22 ENCOUNTER — Other Ambulatory Visit (HOSPITAL_COMMUNITY): Payer: Self-pay

## 2022-01-24 ENCOUNTER — Other Ambulatory Visit: Payer: Self-pay

## 2022-02-16 ENCOUNTER — Other Ambulatory Visit (HOSPITAL_COMMUNITY): Payer: Self-pay

## 2022-02-16 MED ORDER — AMPHETAMINE-DEXTROAMPHETAMINE 30 MG PO TABS
30.0000 mg | ORAL_TABLET | Freq: Two times a day (BID) | ORAL | 0 refills | Status: AC
  Filled 2022-02-17 – 2022-02-18 (×2): qty 60, 30d supply, fill #0

## 2022-02-16 MED ORDER — AMPHETAMINE-DEXTROAMPHETAMINE 30 MG PO TABS
30.0000 mg | ORAL_TABLET | Freq: Two times a day (BID) | ORAL | 0 refills | Status: DC
  Filled 2022-02-18 – 2022-04-20 (×2): qty 60, 30d supply, fill #0

## 2022-02-16 MED ORDER — AMPHETAMINE-DEXTROAMPHETAMINE 30 MG PO TABS
30.0000 mg | ORAL_TABLET | Freq: Two times a day (BID) | ORAL | 0 refills | Status: AC
  Filled 2022-03-20: qty 60, 30d supply, fill #0

## 2022-02-16 MED ORDER — AMPHETAMINE-DEXTROAMPHETAMINE 10 MG PO TABS
10.0000 mg | ORAL_TABLET | Freq: Every day | ORAL | 0 refills | Status: DC
  Filled 2022-02-18 – 2022-04-20 (×2): qty 30, 30d supply, fill #0

## 2022-02-16 MED ORDER — AMPHETAMINE-DEXTROAMPHETAMINE 10 MG PO TABS
10.0000 mg | ORAL_TABLET | Freq: Every day | ORAL | 0 refills | Status: AC
  Filled 2022-02-17 – 2022-02-18 (×3): qty 30, 30d supply, fill #0

## 2022-02-16 MED ORDER — AMPHETAMINE-DEXTROAMPHETAMINE 10 MG PO TABS
10.0000 mg | ORAL_TABLET | Freq: Every day | ORAL | 0 refills | Status: AC
  Filled 2022-03-20: qty 30, 30d supply, fill #0

## 2022-02-17 ENCOUNTER — Other Ambulatory Visit (HOSPITAL_COMMUNITY): Payer: Self-pay

## 2022-02-18 ENCOUNTER — Other Ambulatory Visit (HOSPITAL_COMMUNITY): Payer: Self-pay

## 2022-02-19 ENCOUNTER — Other Ambulatory Visit (HOSPITAL_COMMUNITY): Payer: Self-pay

## 2022-03-21 ENCOUNTER — Other Ambulatory Visit: Payer: Self-pay | Admitting: Internal Medicine

## 2022-03-21 ENCOUNTER — Other Ambulatory Visit (HOSPITAL_COMMUNITY): Payer: Self-pay

## 2022-03-21 ENCOUNTER — Other Ambulatory Visit: Payer: Self-pay

## 2022-03-21 DIAGNOSIS — E038 Other specified hypothyroidism: Secondary | ICD-10-CM

## 2022-03-22 ENCOUNTER — Other Ambulatory Visit: Payer: 59

## 2022-04-20 ENCOUNTER — Other Ambulatory Visit (HOSPITAL_COMMUNITY): Payer: Self-pay

## 2022-04-21 ENCOUNTER — Other Ambulatory Visit (HOSPITAL_COMMUNITY): Payer: Self-pay

## 2022-05-12 ENCOUNTER — Other Ambulatory Visit (HOSPITAL_COMMUNITY): Payer: Self-pay

## 2022-05-12 DIAGNOSIS — Z1231 Encounter for screening mammogram for malignant neoplasm of breast: Secondary | ICD-10-CM | POA: Diagnosis not present

## 2022-05-12 DIAGNOSIS — K3184 Gastroparesis: Secondary | ICD-10-CM | POA: Diagnosis not present

## 2022-05-12 DIAGNOSIS — Z124 Encounter for screening for malignant neoplasm of cervix: Secondary | ICD-10-CM | POA: Diagnosis not present

## 2022-05-12 DIAGNOSIS — F909 Attention-deficit hyperactivity disorder, unspecified type: Secondary | ICD-10-CM | POA: Diagnosis not present

## 2022-05-12 DIAGNOSIS — Z1211 Encounter for screening for malignant neoplasm of colon: Secondary | ICD-10-CM | POA: Diagnosis not present

## 2022-05-12 MED ORDER — AMPHETAMINE-DEXTROAMPHETAMINE 30 MG PO TABS
30.0000 mg | ORAL_TABLET | Freq: Two times a day (BID) | ORAL | 0 refills | Status: AC
  Filled 2022-06-16: qty 60, 30d supply, fill #0

## 2022-05-12 MED ORDER — AMPHETAMINE-DEXTROAMPHETAMINE 30 MG PO TABS
30.0000 mg | ORAL_TABLET | Freq: Two times a day (BID) | ORAL | 0 refills | Status: AC
  Filled 2022-05-18: qty 60, 30d supply, fill #0

## 2022-05-12 MED ORDER — ONDANSETRON HCL 4 MG PO TABS
4.0000 mg | ORAL_TABLET | Freq: Four times a day (QID) | ORAL | 1 refills | Status: AC | PRN
  Filled 2022-05-12: qty 40, 10d supply, fill #0
  Filled 2022-06-23: qty 18, 5d supply, fill #1
  Filled 2022-07-29: qty 18, 5d supply, fill #2

## 2022-05-12 MED ORDER — AMPHETAMINE-DEXTROAMPHETAMINE 10 MG PO TABS
10.0000 mg | ORAL_TABLET | Freq: Every day | ORAL | 0 refills | Status: DC
  Filled 2022-07-14 – 2022-07-15 (×2): qty 30, 30d supply, fill #0

## 2022-05-12 MED ORDER — AMPHETAMINE-DEXTROAMPHETAMINE 10 MG PO TABS
10.0000 mg | ORAL_TABLET | Freq: Every day | ORAL | 0 refills | Status: AC
  Filled 2022-05-18: qty 30, 30d supply, fill #0

## 2022-05-12 MED ORDER — AMPHETAMINE-DEXTROAMPHETAMINE 30 MG PO TABS
30.0000 mg | ORAL_TABLET | Freq: Two times a day (BID) | ORAL | 0 refills | Status: DC
  Filled 2022-06-16 – 2022-07-15 (×3): qty 60, 30d supply, fill #0

## 2022-05-12 MED ORDER — AMPHETAMINE-DEXTROAMPHETAMINE 10 MG PO TABS
10.0000 mg | ORAL_TABLET | Freq: Every day | ORAL | 0 refills | Status: AC
  Filled 2022-06-16: qty 30, 30d supply, fill #0

## 2022-05-17 ENCOUNTER — Other Ambulatory Visit: Payer: Self-pay | Admitting: Family Medicine

## 2022-05-17 DIAGNOSIS — Z1231 Encounter for screening mammogram for malignant neoplasm of breast: Secondary | ICD-10-CM

## 2022-05-18 ENCOUNTER — Other Ambulatory Visit (HOSPITAL_COMMUNITY): Payer: Self-pay

## 2022-05-26 ENCOUNTER — Ambulatory Visit
Admission: RE | Admit: 2022-05-26 | Discharge: 2022-05-26 | Disposition: A | Discharge status: Home / Self Care | Payer: 59 | Source: Ambulatory Visit | Attending: Family Medicine | Admitting: Family Medicine

## 2022-05-26 DIAGNOSIS — Z1231 Encounter for screening mammogram for malignant neoplasm of breast: Secondary | ICD-10-CM

## 2022-05-28 ENCOUNTER — Other Ambulatory Visit (HOSPITAL_COMMUNITY): Payer: Self-pay

## 2022-06-16 ENCOUNTER — Other Ambulatory Visit (HOSPITAL_COMMUNITY): Payer: Self-pay

## 2022-06-23 ENCOUNTER — Other Ambulatory Visit (HOSPITAL_COMMUNITY): Payer: Self-pay

## 2022-07-14 ENCOUNTER — Other Ambulatory Visit (HOSPITAL_COMMUNITY): Payer: Self-pay

## 2022-07-14 ENCOUNTER — Other Ambulatory Visit: Payer: Self-pay

## 2022-07-15 ENCOUNTER — Other Ambulatory Visit (HOSPITAL_COMMUNITY): Payer: Self-pay

## 2022-08-10 ENCOUNTER — Other Ambulatory Visit (HOSPITAL_COMMUNITY): Payer: Self-pay

## 2022-08-11 ENCOUNTER — Other Ambulatory Visit (HOSPITAL_COMMUNITY): Payer: Self-pay

## 2022-08-12 ENCOUNTER — Other Ambulatory Visit (HOSPITAL_COMMUNITY): Payer: Self-pay

## 2022-08-12 MED ORDER — AMPHETAMINE-DEXTROAMPHETAMINE 10 MG PO TABS
10.0000 mg | ORAL_TABLET | Freq: Every day | ORAL | 0 refills | Status: AC
  Filled 2022-08-13: qty 30, 30d supply, fill #0

## 2022-08-13 ENCOUNTER — Other Ambulatory Visit (HOSPITAL_COMMUNITY): Payer: Self-pay

## 2022-08-15 ENCOUNTER — Other Ambulatory Visit (HOSPITAL_COMMUNITY): Payer: Self-pay

## 2022-08-15 MED ORDER — AMPHETAMINE-DEXTROAMPHETAMINE 30 MG PO TABS
30.0000 mg | ORAL_TABLET | Freq: Two times a day (BID) | ORAL | 0 refills | Status: AC
  Filled 2022-08-15: qty 60, 30d supply, fill #0

## 2022-09-05 ENCOUNTER — Other Ambulatory Visit (HOSPITAL_COMMUNITY): Payer: Self-pay

## 2022-09-05 ENCOUNTER — Emergency Department (HOSPITAL_COMMUNITY)
Admission: EM | Admit: 2022-09-05 | Discharge: 2022-09-05 | Disposition: A | Discharge status: Home / Self Care | Payer: 59 | Attending: Emergency Medicine | Admitting: Emergency Medicine

## 2022-09-05 ENCOUNTER — Encounter (HOSPITAL_COMMUNITY): Payer: Self-pay | Admitting: Emergency Medicine

## 2022-09-05 ENCOUNTER — Other Ambulatory Visit: Payer: Self-pay

## 2022-09-05 DIAGNOSIS — H5711 Ocular pain, right eye: Secondary | ICD-10-CM | POA: Insufficient documentation

## 2022-09-05 DIAGNOSIS — E039 Hypothyroidism, unspecified: Secondary | ICD-10-CM | POA: Insufficient documentation

## 2022-09-05 MED ORDER — CIPROFLOXACIN HCL 0.3 % OP SOLN
1.0000 [drp] | OPHTHALMIC | 0 refills | Status: DC
  Filled 2022-09-05: qty 5, 9d supply, fill #0

## 2022-09-05 MED ORDER — TETRACAINE HCL 0.5 % OP SOLN
2.0000 [drp] | Freq: Once | OPHTHALMIC | Status: AC
  Administered 2022-09-05: 2 [drp] via OPHTHALMIC
  Filled 2022-09-05: qty 4

## 2022-09-05 MED ORDER — FLUORESCEIN SODIUM 1 MG OP STRP
1.0000 | ORAL_STRIP | Freq: Once | OPHTHALMIC | Status: AC
  Administered 2022-09-05: 1 via OPHTHALMIC
  Filled 2022-09-05: qty 1

## 2022-09-05 MED ORDER — CIPROFLOXACIN HCL 0.3 % OP SOLN: 1.0000 [drp] | OPHTHALMIC | 0 refills | Status: DC

## 2022-09-05 NOTE — ED Triage Notes (Signed)
Patient coming to ED for evaluation of R eye pain and irriatation.  Reports she noticed pain when putting in contact yesterday morning.  Pain became worse and she took out contact.  Having pain, redness, tearing, and unable to keep eye open.

## 2022-09-05 NOTE — ED Notes (Signed)
Contact is in right eye when doing visual acuity

## 2022-09-05 NOTE — ED Notes (Signed)
Delay in d/c: Needed to obtain and place eye pad. Placed/ taped in place, change pharmacy d/t holiday, and other critical pt in department.

## 2022-09-05 NOTE — Discharge Instructions (Signed)
Please follow-up with the ophthalmologist I have attached your for you today.  Today your exam was reassuring however you will need to see an ophthalmologist for a slit-lamp exam to rule out any corneal abrasion not seen on the exam today.  I have prescribed few antibiotic drops to take in the meantime prophylactically.  He may take Tylenol or ibuprofen every 6 hours as needed for pain.  If symptoms change or worsen please return to ER.

## 2022-09-05 NOTE — ED Provider Notes (Signed)
[name] EMERGENCY DEPARTMENT AT [facility] Provider Note   CSN: [id number] Arrival date & time: 09/05/22  0542     History  Chief Complaint  Patient presents with   Eye Pain    Michaela Bryant is a 47 y.o. female history of hypothyroidism, ADD presented with right eye pain that began yesterday.  Patient states that she wears daily eye contacts but sometimes will sleep with them and as she forgets to take them out.  Patient notes that yesterday when she went to go put her contacts and she had a burning sensation the second she put the contact and that her vision was blurry all day.  Patient wore contacts all day and after seeing a mild still experienced eye discomfort.  Patient notes that her eye appears red and that her vision is decreased but denies any headache, jaw pain, discharge, trauma, fever, recent illness, flashes floaters, pain and dark rooms.  Patient's partner in the room does note he has ocular herpes and is curious if that is contributing to her symptoms even though he is not having a flareup at this time.   Home Medications Prior to Admission medications   Medication Sig Start Date End Date Taking? Authorizing Provider  ciprofloxacin (CILOXAN) 0.3 % ophthalmic solution Place 1 drop into the right eye every 2 (two) hours. Administer 1 drop, every 2 hours, while awake, for 2 days. Then 1 drop, every 4 hours, while awake, for the next 5 days. 09/05/22  Yes Finas Delone, Beverly Gust, PA-C  amphetamine-dextroamphetamine (ADDERALL) 10 MG tablet Take 1 tablet (10 mg total) by mouth daily. 11/11/21     amphetamine-dextroamphetamine (ADDERALL) 10 MG tablet Take 1 tablet (10 mg total) by mouth daily. (fill 12/22/21) 11/11/21     amphetamine-dextroamphetamine (ADDERALL) 10 MG tablet Take 1 tablet (10 mg total) by mouth daily. 02/16/22     amphetamine-dextroamphetamine (ADDERALL) 10 MG tablet Take 1 tablet (10 mg total) by mouth daily. 02/16/22     amphetamine-dextroamphetamine  (ADDERALL) 10 MG tablet Take 1 tablet (10 mg total) by mouth daily 05/12/22     amphetamine-dextroamphetamine (ADDERALL) 10 MG tablet Take 1 tablet (10 mg total) by mouth daily. (fill 06/16/22) 05/12/22     amphetamine-dextroamphetamine (ADDERALL) 10 MG tablet Take 1 tablet (10 mg) by mouth daily. Fill 08/13/2022 08/13/22     amphetamine-dextroamphetamine (ADDERALL) 30 MG tablet Take 1 tablet by mouth 2 (two) times daily. 11/11/21     amphetamine-dextroamphetamine (ADDERALL) 30 MG tablet Take 1 tablet by mouth 2 (two) times daily (fill 12/22/21) 11/11/21     amphetamine-dextroamphetamine (ADDERALL) 30 MG tablet Take 1 tablet by mouth 2 (two) times daily. 02/16/22     amphetamine-dextroamphetamine (ADDERALL) 30 MG tablet Take 1 tablet by mouth 2 (two) times daily. 02/16/22     amphetamine-dextroamphetamine (ADDERALL) 30 MG tablet Take 1 tablet by mouth 2 (two) times daily. 05/12/22     amphetamine-dextroamphetamine (ADDERALL) 30 MG tablet Take 1 tablet by mouth 2 (two) times daily. (fill 06/16/22) 05/12/22     amphetamine-dextroamphetamine (ADDERALL) 30 MG tablet Take 1 tablet by mouth 2 (two) times daily. 08/15/22     hydrochlorothiazide (MICROZIDE) 12.5 MG capsule 30TAKE 1 CAPSULE BY MOUTH DAILY IN THE MORNING AS NEEDED FOR SWELLING/FLUID Patient not taking: Reported on 10/12/2021 02/04/19   Michaela Bryant D, NP  ibuprofen (ADVIL) 800 MG tablet Take 1 tablet (800 mg total) by mouth 3 (three) times daily. 10/12/21   Charlynne Pander, MD  levothyroxine (SYNTHROID)  25 MCG tablet Take 1 tablet (25 mcg total) by mouth daily. 09/22/21   Carlus Pavlov, MD  Multiple Vitamin (MULTIVITAMIN) capsule Take 1 capsule by mouth daily.    [provider]  ondansetron (ZOFRAN) 4 MG tablet Take 1 tablet by mouth every 6 hours as needed, 1 hour before a meal Patient not taking: Reported on 10/12/2021 11/10/20     ondansetron (ZOFRAN) 4 MG tablet Take 1 tablet (4 mg) by mouth every 6 hours as needed (1 hour before meal)  05/12/22     oxyCODONE-acetaminophen (PERCOCET/ROXICET) 5-325 MG tablet Take 1 tablet by mouth every 6 (six) hours as needed for severe pain. 10/12/21   Charlynne Pander, MD  promethazine (PHENERGAN) 25 MG tablet Take 1 tablet (25 mg total) by mouth every 8 (eight) hours as needed for nausea or vomiting. 10/13/21   Petrucelli, Samantha R, PA-C  selenium 200 MCG TABS tablet Take 200 mcg by mouth daily.    [provider]  tamsulosin (FLOMAX) 0.4 MG CAPS capsule Take 1 capsule (0.4 mg total) by mouth daily. 10/12/21   Charlynne Pander, MD  Topiramate ER (TROKENDI XR) 100 MG CP24 Take 1 capsule by mouth once a day Patient not taking: Reported on 10/12/2021 09/08/20     traZODone (DESYREL) 50 MG tablet Take 0.5-1 tablets (25-50 mg total) by mouth at bedtime as needed for sleep. Patient not taking: Reported on 10/12/2021 03/01/18   Michaela Bryant D, NP      Allergies    Gluten meal    Review of Systems   Review of Systems  Eyes:  Positive for pain.    Physical Exam Updated Vital Signs BP (!) 136/99 (BP Location: Right Arm)   Pulse 75   Temp 98.3 F (36.8 C) (Oral)   Resp 18   Ht 5\' 5"  (1.651 m)   Wt 52.2 kg   SpO2 100%   BMI 19.14 kg/m  Physical Exam Constitutional:      General: She is not in acute distress. Eyes:     General: Lids are normal. No visual field deficit.       Right eye: No foreign body, discharge or hordeolum.        Left eye: No foreign body, discharge or hordeolum.     Extraocular Movements: Extraocular movements intact.     Conjunctiva/sclera:     Right eye: Right conjunctiva is injected. No chemosis, exudate or hemorrhage.    Left eye: Left conjunctiva is not injected. No chemosis, exudate or hemorrhage.    Pupils: Pupils are equal, round, and reactive to light.     Comments: Right eye: Vision grossly intact however patient states that due to the burning sensation in her eye patient does appear blurry Visual fields intact  Neurological:     Mental  Status: She is alert.     ED Results / Procedures / Treatments   Labs (all labs ordered are listed, but only abnormal results are displayed) Labs Reviewed - No data to display  EKG None  Radiology No results found.  Procedures Procedures    Medications Ordered in ED Medications  tetracaine (PONTOCAINE) 0.5 % ophthalmic solution 2 drop (2 drops Right Eye Handoff 09/05/22 0637)  fluorescein ophthalmic strip 1 strip (1 strip Right Eye Handoff 09/05/22 4401)    ED Course/ Medical Decision Making/ A&P  Medical Decision Making  UCHENNA GOURD Dam 47 y.o. presented today for eye pain.  Working DDx that I considered at this time includes, but not limited to, corneal abrasion/ulcer, preorbital/orbital cellulitis, acute glaucoma, HSV infection, open globe, conjunctivitis, hordeolum/chalazion, FB, CRAO/CRVO.  R/o DDx: preorbital/orbital cellulitis, acute glaucoma, HSV infection, open globe, hordeolum/chalazion, FB, CRAO/CRVO: These are considered less likely due to history of present illness, physical exam, lab/imaging findings.  Review of prior external notes: 10/12/2021 ED  Unique Tests and My Interpretation:  Visual Acuity: Bilateral 20/25, right near 20/200, left near 20/25 Fluoroscein Stain: No reuptake  Discussion with Independent Historian:  Significant other  Discussion of Management of Tests: None  Risk: Medium: prescription drug management  Risk Stratification Score: None  Plan: On exam patient was in no acute distress with stable vitals.  On exam patient did have injected right conjunctiva however visual fields and acuity were grossly intact despite visual acuity testing done earlier.  Fluorescein stain was conducted which was ultimately negative as I did not note any reuptake or abnormalities.  Intraocular pressure was attempted to be obtained however cannot be obtained and patient requested that we stop due to the discomfort.  Due to  patient stating that her discomfort started after she put contacts and immediately after and has been having discomfort since then with no other triggers I have low suspicion intraocular pressure will help [place] at this time but recommend the patient will need to follow-up with an ophthalmologist to have a slit-lamp exam to assess for any occult corneal abrasions not found on exam due to patient saying that she sleeps with her contacts in and saying that her discomfort began after putting her contacts in.  Patient was prophylactically placed on ciprofloxacin drops with ophthalmology follow-up.  Patient does note after some tetracaine drops that her eye discomfort had improved.  Patient was given return precautions. Patient stable for discharge at this time.  Patient verbalized understanding of plan.         Final Clinical Impression(s) / ED Diagnoses Final diagnoses:  Pain of right eye    Rx / DC Orders ED Discharge Orders          Ordered    ciprofloxacin (CILOXAN) 0.3 % ophthalmic solution  Every 2 hours        09/05/22 0658              Netta Corrigan, PA-C 09/05/22 1610    Benjiman Core, MD 09/05/22 [phone]

## 2022-09-07 ENCOUNTER — Other Ambulatory Visit (HOSPITAL_COMMUNITY): Payer: Self-pay

## 2022-09-08 ENCOUNTER — Other Ambulatory Visit (HOSPITAL_COMMUNITY): Payer: Self-pay

## 2022-09-08 MED ORDER — MOXIFLOXACIN HCL 0.5 % OP SOLN
2.0000 [drp] | OPHTHALMIC | 0 refills | Status: DC
  Filled 2022-09-08: qty 3, 7d supply, fill #0

## 2022-09-20 ENCOUNTER — Other Ambulatory Visit (HOSPITAL_COMMUNITY): Payer: Self-pay

## 2022-09-26 ENCOUNTER — Ambulatory Visit (INDEPENDENT_AMBULATORY_CARE_PROVIDER_SITE_OTHER): Payer: 59 | Admitting: Internal Medicine

## 2022-09-26 ENCOUNTER — Encounter: Payer: Self-pay | Admitting: Internal Medicine

## 2022-09-26 ENCOUNTER — Other Ambulatory Visit: Payer: Self-pay

## 2022-09-26 VITALS — BP 138/90 | HR 102 | Resp 16 | Ht 65.0 in | Wt 122.2 lb

## 2022-09-26 DIAGNOSIS — E038 Other specified hypothyroidism: Secondary | ICD-10-CM | POA: Diagnosis not present

## 2022-09-26 DIAGNOSIS — R7309 Other abnormal glucose: Secondary | ICD-10-CM

## 2022-09-26 DIAGNOSIS — E063 Autoimmune thyroiditis: Secondary | ICD-10-CM

## 2022-09-26 LAB — TSH: TSH: 3.9 u[IU]/mL (ref 0.35–5.50)

## 2022-09-26 LAB — HEMOGLOBIN A1C: Hgb A1c MFr Bld: 6.1 % (ref 4.6–6.5)

## 2022-09-26 LAB — T4, FREE: Free T4: 0.57 ng/dL — ABNORMAL LOW (ref 0.60–1.60)

## 2022-09-26 MED ORDER — LEVOTHYROXINE SODIUM 50 MCG PO TABS: 50.0000 ug | ORAL_TABLET | Freq: Every day | ORAL | 3 refills | Status: DC

## 2022-09-26 NOTE — Progress Notes (Signed)
Patient ID: Michaela Bryant, female   DOB: 06/27/1975, 47 y.o.   MRN: [id number]   HPI  Michaela Bryant is a 47 y.o.-year-old female, returning for follow-up for Hashimoto's thyroiditis and elevated HbA1c.  Last visit 1 year ago.  Interim history: At last visit she had some nausea and hair loss. These resolved.  At today's visit, she feels well, without complaints. She continues to try to reduce sugars and improve diet.   She was in the emergency room with right eye pain 09/05/2022 from keeping the contacts in too long. She had severe corneal abrasion.  This has improved.  Reviewed history: She was diagnosed with hypothyroidism in 2012.  Her TFTs normalized afterwards so she was not started on levothyroxine.  However, a TSH was again elevated in 12/2018, and normalized again afterwards.    At our last visit in 05/2019, she described that for the previous month she had to feel more fatigued, gained weight, decreased interest in exercising. No cold intolerance, constipation, hair loss but she had hair breakage.  Symptoms started after Easter, possibly after she ate gluten.  At that time, she also had an episode of anterior neck pressure so she felt that the symptoms may have been related to the thyroid.  We continued selenium but did not start levothyroxine at that time.  Discussed about improving diet.  She did start to eliminate gluten afterwards and she started to feel much better.  We started levothyroxine 07/2020.  Pt is on levothyroxine 25 mcg daily, taken: - in am -wait 30 minutes before taking her ADHD medicines. - fasting - at least 30 min from b'fast - no calcium - no iron - + multivitamins later in the day - no PPIs - not on Biotin  Reviewed her TFTs: Lab Results  Component Value Date   TSH 1.99 09/21/2021   TSH 2.96 03/18/2021   TSH 2.87 07/01/2020   TSH 1.710 05/10/2019   TSH 2.74 01/23/2019   TSH 7.320 (H) 12/26/2018   TSH 2.880 12/25/2017   TSH 5.964 (H) 08/04/2010    FREET4 0.80 09/21/2021   FREET4 0.73 03/18/2021   FREET4 0.65 07/01/2020   FREET4 0.73 (L) 05/10/2019   FREET4 0.81 01/23/2019   FREET4 1.12 12/26/2018   T3FREE 3.1 07/01/2020   T3FREE 2.4 05/10/2019   T3FREE 3.3 01/23/2019   Lab Results  Component Value Date   T3FREE 3.1 07/01/2020   T3FREE 2.4 05/10/2019   T3FREE 3.3 01/23/2019    Her antithyroid antibodies are elevated: Component     Latest Ref Rng & Units 07/01/2020  Thyroglobulin Ab     < or = 1 IU/mL 3 (H)  Thyroperoxidase Ab SerPl-aCnc     <9 IU/mL 342 (H)   Component     Latest Ref Rng & Units 05/10/2019  Thyroperoxidase Ab SerPl-aCnc     0 - 34 IU/mL 112 (H)  Thyroglobulin Antibody     0.0 - 0.9 IU/mL 2.3 (H)   Component     Latest Ref Rng & Units 01/23/2019  Thyroglobulin Ab     < or = 1 IU/mL 2 (H)  Thyroperoxidase Ab SerPl-aCnc     <9 IU/mL 138 (H)   We started selenium 200 mcg daily 01/2019.  She felt better on this and continues it.  Pt denies: - feeling nodules in neck - hoarseness - dysphagia - choking  Thyroid ultrasound (01/14/2019): thyroid appeared moderately heterogeneous and potentially hyperemic but it was normal in size and  without nodules  She has + FH of thyroid disorders in: MGM - Graves ds, M aunt - goiter. No FH of thyroid cancer. No h/o radiation tx to head or neck. No herbal supplements. No Biotin use. No recent steroids use.   He has a history of celiac disease.  She saw Dr. Loreta Ave. She has a h/o R heel cellulitis and sepsis in 2018. She has cracked skin on soles. Pt. also has a history of ADHD-on Adderall. She has celiac disease and sees Dr. Loreta Ave. She had  repeated episodes of cellulitis due to cracked skin on soles.  She saw rheumatology and dermatology for this without an etiology found.    HbA1c was elevated at last checks: Lab Results  Component Value Date   HGBA1C 6.2 09/21/2021   HGBA1C 5.7 (A) 09/21/2021   HGBA1C 6.4 03/18/2021   HGBA1C 5.6 12/26/2018   She has a family  history of diabetes in maternal grandmother.  ROS:  + see HPI  I reviewed pt's medications, allergies, PMH, social hx, family hx, and changes were documented in the history of present illness. Otherwise, unchanged from my initial visit note.  Past Medical History:  Diagnosis Date   ADD (attention deficit disorder)    Celiac disease    Depression    Pinched vertebral nerve    No past surgical history on file. Social History   Socioeconomic History   Marital status: Divorced    Spouse name: Not on file   Number of children: 2   Years of education: Not on file   Highest education level: Not on file  Occupational History   Occupation: Investment banker, corporate  Tobacco Use   Smoking status: Never   Smokeless tobacco: Never  Vaping Use   Vaping status: Never Used  Substance and Sexual Activity   Alcohol use: Yes    Alcohol/week: 3.0 standard drinks of alcohol    Types: 3 Glasses of wine per week   Drug use: No   Sexual activity: Not Currently  Other Topics Concern   Not on file  Social History Narrative   Not on file   Social Determinants of Health   Financial Resource Strain: Not on file  Food Insecurity: Not on file  Transportation Needs: Not on file  Physical Activity: Not on file  Stress: Not on file  Social Connections: Unknown (05/18/2021)   Received from [facility]   Social Network    Social Network: Not on file  Intimate Partner Violence: Unknown (04/09/2021)   Received from [facility], [facility]   HITS    Physically Hurt: Not on file    Insult or Talk Down To: Not on file    Threaten Physical Harm: Not on file    Scream or Curse: Not on file   Current Outpatient Medications on File Prior to Visit  Medication Sig Dispense Refill   amphetamine-dextroamphetamine (ADDERALL) 10 MG tablet Take 1 tablet (10 mg total) by mouth daily. 30 tablet 0   amphetamine-dextroamphetamine (ADDERALL) 10 MG tablet Take 1 tablet (10 mg total) by mouth daily.  (fill 12/22/21) 30 tablet 0   amphetamine-dextroamphetamine (ADDERALL) 10 MG tablet Take 1 tablet (10 mg total) by mouth daily. 30 tablet 0   amphetamine-dextroamphetamine (ADDERALL) 10 MG tablet Take 1 tablet (10 mg total) by mouth daily. 30 tablet 0   amphetamine-dextroamphetamine (ADDERALL) 10 MG tablet Take 1 tablet (10 mg total) by mouth daily 30 tablet 0   amphetamine-dextroamphetamine (ADDERALL) 10 MG tablet Take  1 tablet (10 mg total) by mouth daily. (fill 06/16/22) 30 tablet 0   amphetamine-dextroamphetamine (ADDERALL) 10 MG tablet Take 1 tablet (10 mg) by mouth daily. Fill 08/13/2022 30 tablet 0   amphetamine-dextroamphetamine (ADDERALL) 30 MG tablet Take 1 tablet by mouth 2 (two) times daily. 60 tablet 0   amphetamine-dextroamphetamine (ADDERALL) 30 MG tablet Take 1 tablet by mouth 2 (two) times daily (fill 12/22/21) 60 tablet 0   amphetamine-dextroamphetamine (ADDERALL) 30 MG tablet Take 1 tablet by mouth 2 (two) times daily. 60 tablet 0   amphetamine-dextroamphetamine (ADDERALL) 30 MG tablet Take 1 tablet by mouth 2 (two) times daily. 60 tablet 0   amphetamine-dextroamphetamine (ADDERALL) 30 MG tablet Take 1 tablet by mouth 2 (two) times daily. 60 tablet 0   amphetamine-dextroamphetamine (ADDERALL) 30 MG tablet Take 1 tablet by mouth 2 (two) times daily. (fill 06/16/22) 60 tablet 0   amphetamine-dextroamphetamine (ADDERALL) 30 MG tablet Take 1 tablet by mouth 2 (two) times daily. 60 tablet 0   ciprofloxacin (CILOXAN) 0.3 % ophthalmic solution Place 1 drop into the right eye every 2 (two) hours. Administer 1 drop, every 2 hours, while awake, for 2 days. Then 1 drop, every 4 hours, while awake, for the next 5 days. 5 mL 0   hydrochlorothiazide (MICROZIDE) 12.5 MG capsule 30TAKE 1 CAPSULE BY MOUTH DAILY IN THE MORNING AS NEEDED FOR SWELLING/FLUID (Patient not taking: Reported on 10/12/2021) 30 capsule 0   ibuprofen (ADVIL) 800 MG tablet Take 1 tablet (800 mg total) by mouth 3 (three) times  daily. 21 tablet 0   levothyroxine (SYNTHROID) 25 MCG tablet Take 1 tablet (25 mcg total) by mouth daily. 90 tablet 3   moxifloxacin (VIGAMOX) 0.5 % ophthalmic solution Place 2 drops into the right eye in the first hour then 4 times a day while awake 3 mL 0   Multiple Vitamin (MULTIVITAMIN) capsule Take 1 capsule by mouth daily.     ondansetron (ZOFRAN) 4 MG tablet Take 1 tablet by mouth every 6 hours as needed, 1 hour before a meal (Patient not taking: Reported on 10/12/2021) 40 tablet 0   ondansetron (ZOFRAN) 4 MG tablet Take 1 tablet (4 mg) by mouth every 6 hours as needed (1 hour before meal) 40 tablet 1   oxyCODONE-acetaminophen (PERCOCET/ROXICET) 5-325 MG tablet Take 1 tablet by mouth every 6 (six) hours as needed for severe pain. 15 tablet 0   promethazine (PHENERGAN) 25 MG tablet Take 1 tablet (25 mg total) by mouth every 8 (eight) hours as needed for nausea or vomiting. 10 tablet 0   selenium 200 MCG TABS tablet Take 200 mcg by mouth daily.     tamsulosin (FLOMAX) 0.4 MG CAPS capsule Take 1 capsule (0.4 mg total) by mouth daily. 10 capsule 0   Topiramate ER (TROKENDI XR) 100 MG CP24 Take 1 capsule by mouth once a day (Patient not taking: Reported on 10/12/2021) 30 capsule 3   traZODone (DESYREL) 50 MG tablet Take 0.5-1 tablets (25-50 mg total) by mouth at bedtime as needed for sleep. (Patient not taking: Reported on 10/12/2021) 30 tablet 3   No current facility-administered medications on file prior to visit.   Allergies  Allergen Reactions   Gluten Meal Nausea Only   Family History  Problem Relation Age of Onset   Breast cancer Mother 100   Alcohol abuse Mother    Cancer Mother        breast   Alcohol abuse Maternal Aunt    Alcohol abuse Maternal Uncle  Diabetes Maternal Grandmother    Hypertension Maternal Grandmother    Alcohol abuse Maternal Grandfather    Hypertension Maternal Grandfather    Stroke Paternal Grandfather    PE: BP (!) 138/90 (BP Location: Right Arm,  Patient Position: Sitting, Cuff Size: Normal)   Pulse (!) 102   Resp 16   Ht 5\' 5"  (1.651 m)   Wt 122 lb 3.2 oz (55.4 kg)   SpO2 98%   BMI 20.34 kg/m  Wt Readings from Last 3 Encounters:  09/26/22 122 lb 3.2 oz (55.4 kg)  09/05/22 115 lb (52.2 kg)  10/12/21 115 lb (52.2 kg)   Constitutional: normal weight, in NAD Eyes: EOMI, no exophthalmos ENT: no thyromegaly, no cervical lymphadenopathy Cardiovascular: tachycardia, RR, No MRG Respiratory: CTA B Musculoskeletal: no deformities Skin: moist, warm, no rashes Neurological: no tremor with outstretched hands  ASSESSMENT: 1.  Hashimoto's hypothyroidism  2.  LA reviewed HbA1c  PLAN:  1. Patient with history of elevated TSH in the past, with high TPO and ATA antibodies, indicative of Hashimoto's thyroiditis. -She is on selenium 200 mcg daily - latest thyroid labs reviewed with pt. >> normal: Lab Results  Component Value Date   TSH 1.99 09/21/2021  - she continues on LT4 25 mcg daily.  She previously did not realize that she was feeling better on this until she ran out of the prescription and was without it for 1.5 months last year.  - pt feels good on this dose.  No complaints at today's visit.  Her blood pressure and pulse are high, but this is because of rushing over here and also has been having problems with her insurance card when checking in. - we discussed about taking the thyroid hormone every day, with water, >30 minutes before breakfast, separated by >4 hours from acid reflux medications, calcium, iron, multivitamins. Pt. is taking it correctly. - will check thyroid tests today: TSH and fT4 - If labs are abnormal, she will need to return for repeat TFTs in 1.5 months - RTC in 1 year  2.  Elevated HbA1c -She now had 2 HbA1c levels in the prediabetic range.  At last visit, the point-of-care HbA1c was 5.7%, however, the venous HbA1c was 6.2%.  At that time and again today's we discussed about improving diet: Reducing  concentrated sweets, fats, and increasing fiber -She have a family history of diabetes in MGM -Will recheck her HbA1c at today's visit  Needs refills.  Component     Latest Ref Rng 09/26/2022  TSH     0.35 - 5.50 uIU/mL 3.90   Hemoglobin A1C     4.6 - 6.5 % 6.1   T4,Free(Direct)     0.60 - 1.60 ng/dL 1.61 (L)   [id number] slightly better.  TSH is normal, but higher within the target range while free T4 is slightly low.  Will suggest to increase the dose of LT4 to 50 mcg daily and recheck her TFTs in 1.5 months.  Carlus Pavlov, MD PhD [facility] Endocr[facility]

## 2022-09-26 NOTE — Patient Instructions (Signed)
Please continue Levothyroxine 25 mcg daily.  Take the thyroid hormone every day, with water, at least 30 minutes before breakfast, separated by at least 4 hours from: - acid reflux medications - calcium - iron - multivitamins  Please stop at the lab.  Please come back for a follow-up appointment in 1 year.

## 2022-09-27 ENCOUNTER — Other Ambulatory Visit (HOSPITAL_COMMUNITY): Payer: Self-pay

## 2022-11-09 DIAGNOSIS — Z23 Encounter for immunization: Secondary | ICD-10-CM | POA: Diagnosis not present

## 2022-11-09 DIAGNOSIS — Z124 Encounter for screening for malignant neoplasm of cervix: Secondary | ICD-10-CM | POA: Diagnosis not present

## 2022-11-09 DIAGNOSIS — Z1231 Encounter for screening mammogram for malignant neoplasm of breast: Secondary | ICD-10-CM | POA: Diagnosis not present

## 2022-11-09 DIAGNOSIS — F909 Attention-deficit hyperactivity disorder, unspecified type: Secondary | ICD-10-CM | POA: Diagnosis not present

## 2022-11-09 DIAGNOSIS — Z1211 Encounter for screening for malignant neoplasm of colon: Secondary | ICD-10-CM | POA: Diagnosis not present

## 2023-01-16 ENCOUNTER — Other Ambulatory Visit (HOSPITAL_COMMUNITY): Payer: Self-pay

## 2023-01-16 ENCOUNTER — Encounter: Payer: Self-pay | Admitting: Internal Medicine

## 2023-01-16 MED ORDER — LEVOTHYROXINE SODIUM 50 MCG PO TABS: 50.0000 ug | ORAL_TABLET | Freq: Every day | ORAL | 1 refills | Status: DC

## 2023-01-16 MED ORDER — LEVOTHYROXINE SODIUM 50 MCG PO TABS
50.0000 ug | ORAL_TABLET | Freq: Every day | ORAL | 1 refills | Status: DC
  Filled 2023-01-16: qty 30, 30d supply, fill #0
  Filled 2023-03-29: qty 30, 30d supply, fill #1
  Filled 2023-05-19: qty 30, 30d supply, fill #2
  Filled 2023-06-28 – 2023-07-19 (×3): qty 30, 30d supply, fill #3
  Filled 2023-09-07: qty 30, 30d supply, fill #4

## 2023-02-17 ENCOUNTER — Other Ambulatory Visit (HOSPITAL_COMMUNITY): Payer: Self-pay

## 2023-03-30 ENCOUNTER — Other Ambulatory Visit (HOSPITAL_COMMUNITY): Payer: Self-pay

## 2023-03-30 MED ORDER — TRAZODONE HCL 50 MG PO TABS
50.0000 mg | ORAL_TABLET | Freq: Every evening | ORAL | 3 refills | Status: DC | PRN
  Filled 2023-03-30: qty 60, 30d supply, fill #0
  Filled 2023-05-19: qty 60, 30d supply, fill #1
  Filled 2023-07-27: qty 60, 30d supply, fill #2
  Filled 2023-09-07: qty 60, 30d supply, fill #3

## 2023-07-03 ENCOUNTER — Other Ambulatory Visit (HOSPITAL_COMMUNITY): Payer: Self-pay

## 2023-07-10 ENCOUNTER — Other Ambulatory Visit (HOSPITAL_COMMUNITY): Payer: Self-pay

## 2023-07-19 ENCOUNTER — Other Ambulatory Visit (HOSPITAL_COMMUNITY): Payer: Self-pay

## 2023-09-07 ENCOUNTER — Other Ambulatory Visit (HOSPITAL_COMMUNITY): Payer: Self-pay

## 2023-09-26 ENCOUNTER — Ambulatory Visit: Payer: Self-pay | Admitting: Internal Medicine

## 2023-10-27 ENCOUNTER — Encounter: Payer: Self-pay | Admitting: Internal Medicine

## 2023-10-27 ENCOUNTER — Ambulatory Visit (INDEPENDENT_AMBULATORY_CARE_PROVIDER_SITE_OTHER): Payer: Self-pay | Admitting: Internal Medicine

## 2023-10-27 VITALS — BP 120/70 | HR 90 | Ht 65.0 in | Wt 124.2 lb

## 2023-10-27 DIAGNOSIS — R7309 Other abnormal glucose: Secondary | ICD-10-CM

## 2023-10-27 DIAGNOSIS — E063 Autoimmune thyroiditis: Secondary | ICD-10-CM

## 2023-10-27 MED ORDER — LEVOTHYROXINE SODIUM 50 MCG PO TABS: 50.0000 ug | ORAL_TABLET | Freq: Every day | ORAL | 3 refills | Status: DC

## 2023-10-27 NOTE — Patient Instructions (Addendum)
 Please restart Levothyroxine  50 mcg daily.  .Take the thyroid  hormone every day, with water, at least 30 minutes before breakfast, separated by at least 4 hours from: - acid reflux medications - calcium - iron - multivitamins  Please come back for labs in 5 weeks.  Please come back for a follow-up appointment in 1 year.

## 2023-10-27 NOTE — Progress Notes (Signed)
 Patient ID: Michaela Bryant, female   DOB: 11-03-1975, 48 y.o.   MRN: [id number]   HPI  Michaela Bryant is a 48 y.o.-year-old female, returning for follow-up for Hashimoto's hypothyroidism and elevated HbA1c.  Last visit 1 year and 1 month ago.  Interim history: At today's visit she feels down - she had family problems since last visit and is also working a lot.  She is able to exercise consistently and continue to pay attention to her diet and try to avoid gluten.  She had hot flashes few months ago, but this resolved. She is currently without insurance, but plans to restart soon.  She has been off her levothyroxine  for few days and she ran out of refills.  She does feel worse after stopping it.  Reviewed history: She was diagnosed with hypothyroidism in 2012.  Her TFTs normalized afterwards so she was not started on levothyroxine .  However, a TSH was again elevated in 12/2018, and normalized again afterwards.    At our last visit in 05/2019, she described that for the previous month she had to feel more fatigued, gained weight, decreased interest in exercising. No cold intolerance, constipation, hair loss but she had hair breakage.  Symptoms started after Easter, possibly after she ate gluten.  At that time, she also had an episode of anterior neck pressure so she felt that the symptoms may have been related to the thyroid .  We continued selenium but did not start levothyroxine  at that time.  Discussed about improving diet.  She did start to eliminate gluten afterwards and she started to feel much better.  We started levothyroxine  07/2020.  We increased the dose of LT4 09/2022.  Pt is on levothyroxine  50 mcg daily, dose increased after last visit: - in am -waits 30 minutes before taking her ADHD medicines. - fasting - at least 30 min from b'fast - no calcium - no iron - + multivitamins later in the day - no PPIs - not on Biotin  Reviewed her TFTs: Lab Results  Component Value Date    TSH 3.90 09/26/2022   TSH 1.99 09/21/2021   TSH 2.96 03/18/2021   TSH 2.87 07/01/2020   TSH 1.710 05/10/2019   TSH 2.74 01/23/2019   TSH 7.320 (H) 12/26/2018   TSH 2.880 12/25/2017   TSH 5.964 (H) 08/04/2010   FREET4 0.57 (L) 09/26/2022   FREET4 0.80 09/21/2021   FREET4 0.73 03/18/2021   FREET4 0.65 07/01/2020   FREET4 0.73 (L) 05/10/2019   FREET4 0.81 01/23/2019   FREET4 1.12 12/26/2018   T3FREE 3.1 07/01/2020   T3FREE 2.4 05/10/2019   T3FREE 3.3 01/23/2019   Lab Results  Component Value Date   T3FREE 3.1 07/01/2020   T3FREE 2.4 05/10/2019   T3FREE 3.3 01/23/2019    Her antithyroid antibodies are elevated: Component     Latest Ref Rng & Units 07/01/2020  Thyroglobulin Ab     < or = 1 IU/mL 3 (H)  Thyroperoxidase Ab SerPl-aCnc     <9 IU/mL 342 (H)   Component     Latest Ref Rng & Units 05/10/2019  Thyroperoxidase Ab SerPl-aCnc     0 - 34 IU/mL 112 (H)  Thyroglobulin Antibody     0.0 - 0.9 IU/mL 2.3 (H)   Component     Latest Ref Rng & Units 01/23/2019  Thyroglobulin Ab     < or = 1 IU/mL 2 (H)  Thyroperoxidase Ab SerPl-aCnc     <9 IU/mL 138 (  H)   We started selenium 200 mcg daily 01/2019.  She felt better on this and continues it.  Pt denies: - feeling nodules in neck - hoarseness - dysphagia - choking He does have occasional globus sensation.  Thyroid  ultrasound (01/14/2019): thyroid  appeared moderately heterogeneous and potentially hyperemic but it was normal in size and without nodules  She has + FH of thyroid  disorders in: MGM - Graves ds, M aunt - goiter. No FH of thyroid  cancer. No h/o radiation tx to head or neck. No herbal supplements. No Biotin use. No recent steroids use.   He has a history of celiac disease.  She saw Dr. Kristie. She has a h/o R heel cellulitis and sepsis in 2018. She has cracked skin on soles. Pt. also has a history of ADHD-on Adderall. She has celiac disease and sees Dr. Kristie. She had  repeated episodes of cellulitis due to  cracked skin on soles.  She saw rheumatology and dermatology for this without an etiology found.    HbA1c was elevated at last checks: Lab Results  Component Value Date   HGBA1C 6.1 09/26/2022   HGBA1C 6.2 09/21/2021   HGBA1C 5.7 (A) 09/21/2021   HGBA1C 6.4 03/18/2021   HGBA1C 5.6 12/26/2018   She has a family history of diabetes in maternal grandmother.  ROS:  + see HPI  I reviewed pt's medications, allergies, PMH, social hx, family hx, and changes were documented in the history of present illness. Otherwise, unchanged from my initial visit note.  Past Medical History:  Diagnosis Date   ADD (attention deficit disorder)    Celiac disease    Depression    Pinched vertebral nerve    No past surgical history on file. Social History   Socioeconomic History   Marital status: Divorced    Spouse name: Not on file   Number of children: 2   Years of education: Not on file   Highest education level: Not on file  Occupational History   Occupation: Investment banker, corporate  Tobacco Use   Smoking status: Never   Smokeless tobacco: Never  Vaping Use   Vaping status: Never Used  Substance and Sexual Activity   Alcohol use: Yes    Alcohol/week: 3.0 standard drinks of alcohol    Types: 3 Glasses of wine per week   Drug use: No   Sexual activity: Not Currently  Other Topics Concern   Not on file  Social History Narrative   Not on file   Social Drivers of Health   Financial Resource Strain: Not on file  Food Insecurity: Not on file  Transportation Needs: Not on file  Physical Activity: Not on file  Stress: Not on file  Social Connections: Unknown (05/18/2021)   Received from [facility]   Social Network    Social Network: Not on file  Intimate Partner Violence: Unknown (04/09/2021)   Received from [facility]   HITS    Physically Hurt: Not on file    Insult or Talk Down To: Not on file    Threaten Physical Harm: Not on file    Scream or Curse: Not on file   Current  Outpatient Medications on File Prior to Visit  Medication Sig Dispense Refill   amphetamine -dextroamphetamine  (ADDERALL) 10 MG tablet Take 1 tablet (10 mg total) by mouth daily. 30 tablet 0   amphetamine -dextroamphetamine  (ADDERALL) 10 MG tablet Take 1 tablet (10 mg total) by mouth daily. (fill 12/22/21) 30 tablet 0   amphetamine -dextroamphetamine  (ADDERALL) 10 MG tablet  Take 1 tablet (10 mg total) by mouth daily. 30 tablet 0   amphetamine -dextroamphetamine  (ADDERALL) 10 MG tablet Take 1 tablet (10 mg total) by mouth daily. 30 tablet 0   amphetamine -dextroamphetamine  (ADDERALL) 10 MG tablet Take 1 tablet (10 mg total) by mouth daily 30 tablet 0   amphetamine -dextroamphetamine  (ADDERALL) 10 MG tablet Take 1 tablet (10 mg total) by mouth daily. (fill 06/16/22) 30 tablet 0   amphetamine -dextroamphetamine  (ADDERALL) 10 MG tablet Take 1 tablet (10 mg) by mouth daily. Fill 08/13/2022 30 tablet 0   amphetamine -dextroamphetamine  (ADDERALL) 30 MG tablet Take 1 tablet by mouth 2 (two) times daily. 60 tablet 0   amphetamine -dextroamphetamine  (ADDERALL) 30 MG tablet Take 1 tablet by mouth 2 (two) times daily (fill 12/22/21) 60 tablet 0   amphetamine -dextroamphetamine  (ADDERALL) 30 MG tablet Take 1 tablet by mouth 2 (two) times daily. 60 tablet 0   amphetamine -dextroamphetamine  (ADDERALL) 30 MG tablet Take 1 tablet by mouth 2 (two) times daily. 60 tablet 0   amphetamine -dextroamphetamine  (ADDERALL) 30 MG tablet Take 1 tablet by mouth 2 (two) times daily. 60 tablet 0   amphetamine -dextroamphetamine  (ADDERALL) 30 MG tablet Take 1 tablet by mouth 2 (two) times daily. (fill 06/16/22) 60 tablet 0   amphetamine -dextroamphetamine  (ADDERALL) 30 MG tablet Take 1 tablet by mouth 2 (two) times daily. 60 tablet 0   ciprofloxacin  (CILOXAN ) 0.3 % ophthalmic solution Place 1 drop into the right eye every 2 (two) hours. Administer 1 drop, every 2 hours, while awake, for 2 days. Then 1 drop, every 4 hours, while awake, for the next  5 days. 5 mL 0   hydrochlorothiazide  (MICROZIDE ) 12.5 MG capsule 30TAKE 1 CAPSULE BY MOUTH DAILY IN THE MORNING AS NEEDED FOR SWELLING/FLUID 30 capsule 0   levothyroxine  (SYNTHROID ) 50 MCG tablet Take 1 tablet (50 mcg total) by mouth daily. 90 tablet 1   moxifloxacin  (VIGAMOX ) 0.5 % ophthalmic solution Place 2 drops into the right eye in the first hour then 4 times a day while awake 3 mL 0   Multiple Vitamin (MULTIVITAMIN) capsule Take 1 capsule by mouth daily.     ondansetron  (ZOFRAN ) 4 MG tablet Take 1 tablet by mouth every 6 hours as needed, 1 hour before a meal 40 tablet 0   ondansetron  (ZOFRAN ) 4 MG tablet Take 1 tablet (4 mg) by mouth every 6 hours as needed (1 hour before meal) 40 tablet 1   selenium 200 MCG TABS tablet Take 200 mcg by mouth daily.     Topiramate  ER (TROKENDI  XR) 100 MG CP24 Take 1 capsule by mouth once a day 30 capsule 3   traZODone  (DESYREL ) 50 MG tablet Take 0.5-1 tablets (25-50 mg total) by mouth at bedtime as needed for sleep. 30 tablet 3   traZODone  (DESYREL ) 50 MG tablet Take 1-2 tablets (50-100 mg total) by mouth at bedtime as needed. 60 tablet 3   No current facility-administered medications on file prior to visit.   Allergies  Allergen Reactions   Gluten Meal Nausea Only   Family History  Problem Relation Age of Onset   Breast cancer Mother 59   Alcohol abuse Mother    Cancer Mother        breast   Alcohol abuse Maternal Aunt    Alcohol abuse Maternal Uncle    Diabetes Maternal Grandmother    Hypertension Maternal Grandmother    Alcohol abuse Maternal Grandfather    Hypertension Maternal Grandfather    Stroke Paternal Grandfather    PE: BP 120/70  Pulse 90   Ht 5' 5 (1.651 m)   Wt 124 lb 3.2 oz (56.3 kg)   SpO2 99%   BMI 20.67 kg/m  Wt Readings from Last 3 Encounters:  10/27/23 124 lb 3.2 oz (56.3 kg)  09/26/22 122 lb 3.2 oz (55.4 kg)  09/05/22 115 lb (52.2 kg)   Constitutional: normal weight, in NAD Eyes: EOMI, no exophthalmos ENT:  no thyromegaly, no cervical lymphadenopathy Cardiovascular: RRR, No MRG Respiratory: CTA B Musculoskeletal: no deformities Skin: no rashes Neurological: no tremor with outstretched hands  ASSESSMENT: 1.  Hashimoto's hypothyroidism  2.  Elevated HbA1c  PLAN:  1. Patient with history of elevated TSH in the past, with high ATA and TPO antibodies, indicative of Hashimoto's thyroiditis. - She is on selenium 200 mcg daily - latest thyroid  labs reviewed with pt. >> normal: Lab Results  Component Value Date   TSH 3.90 09/26/2022  - she continued on LT4 50 mcg daily, dose increased after the above results, but unfortunately she ran out few days ago and was not able to refill it.  I again advised her to let me know if this happens again before our appointment, so I can call in at least enough tablets to cover her until I can see her in clinic and we can check labs. - pt feels good on this dose so for now I sent another prescription for the same dose  - we discussed about taking the thyroid  hormone every day, with water, >30 minutes before breakfast, separated by >4 hours from acid reflux medications, calcium, iron, multivitamins. Pt. is taking it correctly. - will check thyroid  tests after she starts to take it consistently: TSH and fT4 - If labs are abnormal, she will need to return for repeat TFTs in 1.5 months - OTW, RTC in 1 year  2.  Elevated HbA1c -Patient has several elevated HbA1c, in the prediabetic range.  Highest was 6.2%, while at last visit, this returned 6.1%. -She did reduce concentrated sweets, and we discussed about reducing fats and increasing fiber. - She is exercising consistently - She have a family history of diabetes in MGM - Will recheck her HbA1c at next lab draw, in 1.5 months  Requested Prescriptions   Signed Prescriptions Disp Refills   levothyroxine  (SYNTHROID ) 50 MCG tablet 90 tablet 3    Sig: Take 1 tablet (50 mcg total) by mouth daily.   Orders Placed This  Encounter  Procedures   TSH   Hemoglobin A1c   Lela Fendt, MD PhD [facility] Endocrinology

## 2023-11-15 ENCOUNTER — Other Ambulatory Visit (HOSPITAL_COMMUNITY): Payer: Self-pay

## 2023-11-15 MED ORDER — AMPHETAMINE-DEXTROAMPHETAMINE 30 MG PO TABS
30.0000 mg | ORAL_TABLET | Freq: Two times a day (BID) | ORAL | 0 refills | Status: AC
  Filled 2023-11-17: qty 60, 30d supply, fill #0

## 2023-11-15 MED ORDER — AMPHETAMINE-DEXTROAMPHETAMINE 10 MG PO TABS: 10.0000 mg | ORAL_TABLET | Freq: Every day | ORAL | 0 refills | Status: AC

## 2023-11-15 MED ORDER — AMPHETAMINE-DEXTROAMPHETAMINE 30 MG PO TABS
30.0000 mg | ORAL_TABLET | Freq: Two times a day (BID) | ORAL | 0 refills | Status: AC
  Filled 2023-11-15 – 2023-12-18 (×3): qty 60, 30d supply, fill #0

## 2023-11-15 MED ORDER — AMPHETAMINE-DEXTROAMPHETAMINE 10 MG PO TABS
10.0000 mg | ORAL_TABLET | Freq: Every day | ORAL | 0 refills | Status: AC
  Filled 2024-01-10: qty 30, 30d supply, fill #0

## 2023-11-15 MED ORDER — AMPHETAMINE-DEXTROAMPHETAMINE 30 MG PO TABS
30.0000 mg | ORAL_TABLET | Freq: Two times a day (BID) | ORAL | 0 refills | Status: AC
  Filled 2023-12-16 – 2023-12-18 (×2): qty 60, 30d supply, fill #0

## 2023-11-15 MED ORDER — AMPHETAMINE-DEXTROAMPHETAMINE 10 MG PO TABS
10.0000 mg | ORAL_TABLET | Freq: Every day | ORAL | 0 refills | Status: AC
  Filled 2023-11-20: qty 30, 30d supply, fill #0

## 2023-11-17 ENCOUNTER — Other Ambulatory Visit (HOSPITAL_COMMUNITY): Payer: Self-pay

## 2023-11-20 ENCOUNTER — Other Ambulatory Visit (HOSPITAL_COMMUNITY): Payer: Self-pay

## 2023-11-20 ENCOUNTER — Other Ambulatory Visit: Payer: Self-pay

## 2023-11-29 ENCOUNTER — Other Ambulatory Visit (HOSPITAL_COMMUNITY): Payer: Self-pay

## 2023-11-29 MED ORDER — TRAZODONE HCL 50 MG PO TABS
50.0000 mg | ORAL_TABLET | Freq: Every evening | ORAL | 3 refills | Status: AC | PRN
  Filled 2023-11-29: qty 60, 30d supply, fill #0

## 2023-12-01 ENCOUNTER — Other Ambulatory Visit (HOSPITAL_COMMUNITY): Payer: Self-pay

## 2023-12-05 ENCOUNTER — Other Ambulatory Visit: Payer: Self-pay

## 2023-12-06 ENCOUNTER — Ambulatory Visit: Payer: Self-pay | Admitting: Internal Medicine

## 2023-12-06 LAB — TSH: TSH: 4.47 m[IU]/L

## 2023-12-06 LAB — HEMOGLOBIN A1C
Hgb A1c MFr Bld: 6 % — ABNORMAL HIGH (ref <5.7)
Mean Plasma Glucose: 126 mg/dL
eAG (mmol/L): 7 mmol/L

## 2023-12-07 ENCOUNTER — Other Ambulatory Visit: Payer: Self-pay | Admitting: Internal Medicine

## 2023-12-07 ENCOUNTER — Other Ambulatory Visit (HOSPITAL_COMMUNITY): Payer: Self-pay

## 2023-12-07 DIAGNOSIS — E063 Autoimmune thyroiditis: Secondary | ICD-10-CM

## 2023-12-07 MED ORDER — LEVOTHYROXINE SODIUM 75 MCG PO TABS
75.0000 ug | ORAL_TABLET | Freq: Every day | ORAL | 1 refills | Status: AC
  Filled 2023-12-07 – 2024-01-10 (×2): qty 90, 90d supply, fill #0

## 2023-12-16 ENCOUNTER — Other Ambulatory Visit (HOSPITAL_COMMUNITY): Payer: Self-pay

## 2023-12-18 ENCOUNTER — Other Ambulatory Visit (HOSPITAL_COMMUNITY): Payer: Self-pay

## 2023-12-19 ENCOUNTER — Other Ambulatory Visit (HOSPITAL_COMMUNITY): Payer: Self-pay

## 2024-01-10 ENCOUNTER — Other Ambulatory Visit (HOSPITAL_COMMUNITY): Payer: Self-pay

## 2024-01-10 ENCOUNTER — Other Ambulatory Visit: Payer: Self-pay

## 2024-02-01 ENCOUNTER — Other Ambulatory Visit (HOSPITAL_COMMUNITY): Payer: Self-pay

## 2024-02-01 MED ORDER — AMPHETAMINE-DEXTROAMPHETAMINE 10 MG PO TABS: 10.0000 mg | ORAL_TABLET | Freq: Every day | ORAL | 0 refills | Status: DC

## 2024-02-01 MED ORDER — AMPHETAMINE-DEXTROAMPHETAMINE 30 MG PO TABS: 30.0000 mg | ORAL_TABLET | Freq: Two times a day (BID) | ORAL | 0 refills | Status: DC

## 2024-02-01 MED ORDER — AMPHETAMINE-DEXTROAMPHETAMINE 10 MG PO TABS
10.0000 mg | ORAL_TABLET | Freq: Every day | ORAL | 0 refills | Status: DC
  Filled 2024-02-01: qty 30, 30d supply, fill #0

## 2024-10-28 ENCOUNTER — Ambulatory Visit: Payer: Self-pay | Admitting: Internal Medicine
